# Patient Record
Sex: Male | Born: 1970 | Race: White | Hispanic: No | Marital: Single
Health system: Southern US, Community
[De-identification: ages and names within clinical notes are randomized; demographics above are authoritative.]

## PROBLEM LIST (undated history)

## (undated) DIAGNOSIS — M549 Dorsalgia, unspecified: Secondary | ICD-10-CM

## (undated) DIAGNOSIS — F32A Depression, unspecified: Secondary | ICD-10-CM

## (undated) DIAGNOSIS — K859 Acute pancreatitis without necrosis or infection, unspecified: Secondary | ICD-10-CM

## (undated) DIAGNOSIS — F411 Generalized anxiety disorder: Secondary | ICD-10-CM

## (undated) DIAGNOSIS — M48061 Spinal stenosis, lumbar region without neurogenic claudication: Secondary | ICD-10-CM

## (undated) DIAGNOSIS — F329 Major depressive disorder, single episode, unspecified: Secondary | ICD-10-CM

## (undated) DIAGNOSIS — I10 Essential (primary) hypertension: Secondary | ICD-10-CM

## (undated) DIAGNOSIS — G4700 Insomnia, unspecified: Secondary | ICD-10-CM

## (undated) DIAGNOSIS — F17200 Nicotine dependence, unspecified, uncomplicated: Secondary | ICD-10-CM

## (undated) DIAGNOSIS — M1990 Unspecified osteoarthritis, unspecified site: Secondary | ICD-10-CM

## (undated) HISTORY — DX: Essential (primary) hypertension: I10

## (undated) HISTORY — DX: Major depressive disorder, single episode, unspecified: F32.9

## (undated) HISTORY — DX: Unspecified osteoarthritis, unspecified site: M19.90

## (undated) HISTORY — DX: Nicotine dependence, unspecified, uncomplicated: F17.200

## (undated) HISTORY — DX: Depression, unspecified: F32.A

## (undated) HISTORY — DX: Insomnia, unspecified: G47.00

## (undated) HISTORY — DX: Acute pancreatitis without necrosis or infection, unspecified: K85.90

## (undated) HISTORY — DX: Spinal stenosis, lumbar region without neurogenic claudication: M48.061

## (undated) HISTORY — DX: Dorsalgia, unspecified: M54.9

## (undated) HISTORY — DX: Generalized anxiety disorder: F41.1

---

## 1999-11-26 ENCOUNTER — Ambulatory Visit (HOSPITAL_COMMUNITY): Admission: RE | Admit: 1999-11-26 | Discharge: 1999-11-26 | Payer: Self-pay | Admitting: Family Medicine

## 2004-12-13 ENCOUNTER — Inpatient Hospital Stay (HOSPITAL_COMMUNITY): Admission: AD | Admit: 2004-12-13 | Discharge: 2004-12-16 | Payer: Self-pay | Admitting: Psychiatry

## 2004-12-13 ENCOUNTER — Encounter: Payer: Self-pay | Admitting: *Deleted

## 2004-12-14 ENCOUNTER — Ambulatory Visit: Payer: Self-pay | Admitting: Psychiatry

## 2007-09-23 ENCOUNTER — Encounter: Admission: RE | Admit: 2007-09-23 | Discharge: 2007-09-23 | Payer: Self-pay | Admitting: Gastroenterology

## 2007-10-12 ENCOUNTER — Ambulatory Visit (HOSPITAL_COMMUNITY): Admission: RE | Admit: 2007-10-12 | Discharge: 2007-10-12 | Payer: Self-pay | Admitting: Gastroenterology

## 2007-12-02 ENCOUNTER — Ambulatory Visit (HOSPITAL_COMMUNITY): Admission: RE | Admit: 2007-12-02 | Discharge: 2007-12-02 | Payer: Self-pay | Admitting: General Surgery

## 2007-12-02 ENCOUNTER — Encounter (INDEPENDENT_AMBULATORY_CARE_PROVIDER_SITE_OTHER): Payer: Self-pay | Admitting: General Surgery

## 2008-06-16 HISTORY — PX: APPENDECTOMY: SHX54

## 2008-11-16 HISTORY — PX: CHOLECYSTECTOMY: SHX55

## 2009-07-11 ENCOUNTER — Encounter: Admission: RE | Admit: 2009-07-11 | Discharge: 2009-07-11 | Payer: Self-pay | Admitting: Gastroenterology

## 2009-08-21 ENCOUNTER — Ambulatory Visit (HOSPITAL_COMMUNITY): Admission: RE | Admit: 2009-08-21 | Discharge: 2009-08-21 | Payer: Self-pay | Admitting: Gastroenterology

## 2009-12-11 ENCOUNTER — Emergency Department (HOSPITAL_COMMUNITY): Admission: EM | Admit: 2009-12-11 | Discharge: 2009-12-11 | Payer: Self-pay | Admitting: Emergency Medicine

## 2009-12-15 ENCOUNTER — Emergency Department (HOSPITAL_COMMUNITY): Admission: EM | Admit: 2009-12-15 | Discharge: 2009-12-15 | Payer: Self-pay | Admitting: Emergency Medicine

## 2010-07-01 NOTE — Op Note (Signed)
NAME:  Evan Keller, Evan Keller NO.:  [id number]   MEDICAL RECORD NO.:  [id number]          PATIENT TYPE:  AMB   LOCATION:  DAY                          FACILITY:  [facility]   PHYSICIAN:  Ollen Gross. Vernell Morgans, M.D. DATE OF BIRTH:  Jun 18, 1970   DATE OF PROCEDURE:  12/02/2007  DATE OF DISCHARGE:                               OPERATIVE REPORT   PREOPERATIVE DIAGNOSIS:  Gallstones.   POSTOPERATIVE DIAGNOSIS:  Gallstones.   PROCEDURE:  Laparoscopic cholecystectomy with intraoperative  cholangiogram.   SURGEON:  Ollen Gross. Vernell Morgans, M.D.   ASSISTANT:  Leonie Man, M.D.   ANESTHESIA:  General endotracheal.   PROCEDURE IN DETAIL:  After informed consent was obtained the patient  was brought to the operating room and placed in the supine position on  the operating room table.  After adequate induction of general  anesthesia the patient's abdomen was prepped with Betadine and draped in  the usual sterile manner.  A small incision was made just below the  umbilicus.  The patient had had a previous laparoscopic appendectomy and  his old scar was removed sharply with an 11 blade knife.  The area prior  to making the incision was infiltrated with 0.25% Marcaine.  The  incision was carried down through the subcutaneous tissue bluntly with a  hemostat and [employer] retractors until the midline fascia was  identified.  The midline fascia was incised with a 15 blade knife and  each side was grasped with Kocher clamps and elevated anteriorly.  The  preperitoneal space was probed bluntly with a hemostat until the  peritoneum was opened and access was gained to the abdominal cavity.  A  zero Vicryl pursestring stitch was placed in the fascia surrounding the  opening and Hasson cannula was placed through the opening and anchored  into place with the previously placed Vicryl pursestring stitch.  The  abdomen was then insufflated with carbon dioxide without difficulty.  The patient was placed in  reverse Trendelenburg position and rotated  with the right side up.  A laparoscope was inserted through the Hasson  cannula and the right upper quadrant was inspected.  The dome of the  gallbladder and liver readily identified.  Next the epigastric region  was infiltrated with 0.25% Marcaine and a small incision was made with a  15 blade knife and a 10 mm port was placed bluntly through this incision  into the abdominal cavity under direct vision.  Sites were then chosen  laterally on the right side of the abdomen for replacement of the 5 mm  ports.  Each of these areas was infiltrated with 0.25% Marcaine.  Small  stab incisions were made with a 15 blade knife.  Five mm ports were  placed bluntly through these incisions into the abdominal cavity under  direct vision.  A blunt grasper was placed through the lateral-most 5 mm  port and used to grasp the dome of the gallbladder and elevate it  anteriorly and superiorly.  Another blunt grasper was placed through the  other 5 mm port and used to retract on the body  and neck of the  gallbladder.  There were some omental adhesions to the body of the  gallbladder that were taken down by some blunt dissection with the  dissector and some sharp dissection with the electrocautery.  Once this  was accomplished we were able to identify the gallbladder neck area.  Using the electrocautery the peritoneal reflection at the gallbladder  neck was opened.  Blunt dissection was then carried out in this area  until the gallbladder neck/cystic duct junction was readily identified  and a good window was created.  A single clip was placed on the  gallbladder neck.  A small ductotomy was made just below the clip with  the laparoscopic scissors.  A 14 gauge Angiocath was placed  percutaneously through the anterior abdominal wall under direct vision.  A Reddick cholangiogram catheter was placed through the Angiocath and  flushed.  The Reddick catheter was then  placed within the cystic duct  and anchored in place with a clip.  A cholangiogram was obtained that  showed no filling defects, good emptying in the duodenum and adequate  length on the cystic duct.  The anchoring clip and catheter was then  removed from the patient.  Three clips were placed proximally on the  cystic duct and the duct was divided between the two sets of clips.  Posterior to this the cystic artery was identified and again dissected  bluntly in a circumferential manner until a good window was created.  Two clips were placed proximally and one distally and the artery was  divided between the two.  Next the laparoscopic hook cautery device was  used to separate the gallbladder from the liver bed.  Prior to  completely detaching the gallbladder from the liver bed the liver bed  was inspected and several small bleeding points were coagulated with  electrocautery until the area was completely hemostatic.  The  gallbladder was then detached the rest of the way from the liver bed  without difficulty.  A laparoscopic bag was inserted through the  epigastric port.  The gallbladder was placed in the bag and the bag was  sealed.  The abdomen was then irrigated with copious amounts of saline  until the effluent was clear.  The laparoscope was then removed through  the epigastric port.  The infraumbilical port site was then examined  intra-abdominally with the camera.  There was no evidence of any sort of  hernia or abnormality.  A gallbladder grasper was then placed through  the Hasson cannula and used to grasp and open the bag.  The bag with the  gallbladder was removed through the infraumbilical port without  difficulty.  The fascial defect was closed with the previously placed  Vicryl pursestring stitch as well as with another figure-of-eight zero  Vicryl stitch.  The rest of the ports were removed under direct vision  and were found to be hemostatic.  The gas was allowed to escape.   The  skin incisions were all closed with interrupted 4-0 Monocryl  subcuticular stitches.  Dermabond dressings were applied.  The patient  tolerated the procedure well and at the end of the case all needle,  sponge and instrument counts were correct.  The patient was then  awakened and taken to recovery in a stable condition.      Ollen Gross. Vernell Morgans, M.D.  Electronically Signed     PST/MEDQ  D:  12/02/2007  T:  12/02/2007  Job:  [id number]

## 2010-07-04 NOTE — H&P (Signed)
NAME:  Evan Keller, DONATH NO.:  [id number]   MEDICAL RECORD NO.:  [id number]          PATIENT TYPE:  IPS   LOCATION:  0504                          [facility]  [facility]   PHYSICIAN:  Geoffery Lyons, M.D.      DATE OF BIRTH:  [birth year]   DATE OF ADMISSION:  12/13/2004  DATE OF DISCHARGE:                         PSYCHIATRIC ADMISSION ASSESSMENT   IDENTIFYING INFORMATION:  The patient is a 40 year old single white male who  was involuntarily admitted to the [facility] on December 13, 2004.   HISTORY OF PRESENT ILLNESS:  The patient has been experiencing increasing  signs and symptoms of depression over approximately a 41-month period.  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  He tried to commit suicide by carbon monoxide asphyxiation and  was admitted to [facility] for treatment of the same.  Then he was  committed to the [facility].  He denies any current suicidal  ideations or homicidal ideation.  He denies ever having had any auditory or  visual hallucinations.  Screening for mania was negative on for the most  part except for some impulsive behavior, some difficulty in controlling his  anger in the past.  The patient also has been using Vicodin that has been  prescribed for him for an iliotibial band syndrome problem that he has which  is prescribed for him to take three times a day.  He is taking it, he says,  about 5 times a day, and he is also requesting detox from opiates.   PAST PSYCHIATRIC HISTORY:  This is his first admission to the [facility].  He has had no other inpatient psychiatric or detoxification  admissions and no outpatient following.   SOCIAL HISTORY:  The patient lives alone.  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  He has  never been married.  He has no kids.  He was arrested recently for  prescription fraud, writing a prescription for pain medication.  He reports  that he really has no social support system other than his mother who he  talks to occasionally.   FAMILY HISTORY:  He knows of no mental illness.  He reports that his father  abuses alcohol, and his brother smokes marijuana.   ALCOHOL AND DRUG HISTORY:  He drinks alcohol 4-5 drinks daily and has been  somewhat abusing his pain medication.   PRIMARY CARE Roth Ress:  He has no current primary care Alydia Gosser as his  previous primary care Haik Mahoney, Dr. Ruthine Dose, recently moved away.   MEDICAL PROBLEMS:  He has some hypertension which is not treated with any  medication.  He treats it with diet and exercise.  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   MEDICATIONS:  1.  Vicodin, prescribed three times daily.  2.  Xanax 0.5 mg three times a day as needed, which he takes only      occasionally.  3.  He has been on Zoloft in the past and stopped due to weight gain and a      loss of efficacy.   DRUG ALLERGIES:  He knows of no known drug allergies.   PHYSICAL EXAMINATION:  Performed at [facility].  Here at the [facility] he is a well-nourished, well-developed white male who appears  appropriate for his stated age of 23.  His most recent vital signs showed a  temperature of 98.2, pulse 68, respirations 18, blood pressure 146/88.   DIAGNOSTIC STUDIES:  His hematocrit and hemoglobin are within normal limits.  His blood chemistries reveal a mildly low potassium of 3.4.  His liver  function tests are normal.  His urine drug screen was positive for  benzodiazepines and opiates.  At [facility] on December 12, 2004, his alcohol  level was 126.  He has a TSH pending.   MENTAL STATUS EXAM:  He is alert and oriented x 4 with good eye contact and  a blunted affect.  His appearance was disheveled, and his behavior was calm  and  cooperative.  Speech was clear with even pace and tone.  His mood was  depressed.  His thought process was coherent without current suicidal or  homicidal ideation.  No evidence of psychosis or mania was observed.  Cognitive function, his concentration is normal.  His memory is normal.  His  insight and judgment are poor.   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  AXIS II:  Deferred.  AXIS III:  Iliotibial band syndrome.  Hypertension.  AXIS IV:  Moderate-to-severe for problems with his primary support group,  occupational problems, economic problems, problems related to the legal  system.  AXIS V:  Current global assessment of function 32 with past year of 65.   PLAN:  Admit the patient involuntarily.  He is placed on a clonidine  protocol for opiate withdrawal and Librium protocol for alcohol and benzo  withdrawal.  We will start Cymbalta on him.  We can consider adding a mood  stabilizer.  We will work to stabilize his mood and thought process.  We  will work to increase his coping skills and decrease his stressors.  He will  need referral for counseling and for psychiatric followup upon discharge.  His tentative length of stay is 5-7 days.      Yolande Jolly, PA      Geoffery Lyons, M.D.  Electronically Signed    AHW/MEDQ  D:  12/14/2004  T:  12/14/2004  Job:  [id number]

## 2010-07-04 NOTE — Discharge Summary (Signed)
NAME:  Evan Keller, Evan Keller NO.:  [id number]   MEDICAL RECORD NO.:  [id number]          PATIENT TYPE:  IPS   LOCATION:  0503                          [facility]ILITY[facility]  [facility]   PHYSICIAN:  Geoffery Lyons, M.D.      DATE OF BIRTH:  1970-09-24   DATE OF ADMISSION:  12/13/2004  DATE OF DISCHARGE:  12/16/2004                                 DISCHARGE SUMMARY   CHIEF COMPLAINT AND PRESENT ILLNESS:  This was the first admission to [facility] for this 40 year old single white male who was  involuntarily admitted to the [facility].  Had been  experiencing increasing signs and symptoms of depression over a four-month  period.  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  They need to  apply for bankruptcy, loss of an engagement, lack of friends and increased  alcohol consumption.  He tried to commit suicide by carbon monoxide  asphyxiation.  He was admitted to [facility] for treatment.  He was  committed to [facility].  Upon this evaluation, denied any current  suicidal or homicidal ideation. Denies having had any auditory or visual  hallucinations.  He did admit to come impulsive behavior, some difficulty in  controlling his anger in the past.  Has been using Vicodin, taking it five  times or more a day.   PAST PSYCHIATRIC HISTORY:  First time at [facility].  No other  inpatient or outpatient treatment.   ALCOHOL/DRUG HISTORY:  Drinks 4-5 drinks daily.  Has been abusing his pain  medications.   MEDICAL HISTORY:  Denies any history of any major medical conditions.  Had  some hypertension, not treated.  Has had iliotibial __________ syndrome in  legs bilaterally.   MEDICATIONS:  Vicodin prescribed three times a day, Xanax 0.5 mg three times  a day as needed, Zoloft.   PHYSICAL EXAMINATION:  Performed and failed to show any acute findings.   LABORATORY DATA:  Blood chemistry with glucose 129, potassium 3.4.  Liver  enzymes with  SGOT 27, SGPT 26, TSH 1.018.   MENTAL STATUS EXAM:  Alert, cooperative male with good eye contact.  Blunted  affect.  Disheveled.  He was calm, cooperative.  Speech was clear, even pace  and tone.  Mood was depressed.  Thought processes were coherent, relevant.  No suicidal or homicidal ideation.  No delusions.  Cognition was well-  preserved.   ADMISSION DIAGNOSES:  AXIS I:  Opiate abuse.  Rule out alcohol abuse.  Depressive disorder not otherwise specified.  AXIS II:  No diagnosis.  AXIS III:  Iliotibial __________ syndrome, hypertension by history.  AXIS IV:  Moderate.  AXIS V:  GAF upon admission 32; highest GAF in the last year 65.   HOSPITAL COURSE:  He was admitted.  He was started in individual and group  psychotherapy.  He was detoxified with Librium as well as clonidine.  He was  given some Seroquel.  He was started on Cymbalta 30 mg per day.  That was  discontinued and he was placed on Wellbutrin as well as trazodone for sleep.  He endorsed increased signs and symptoms of depression.  He was abusing the  opiates.  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  He is close to declaring bankruptcy.  Broke up an engagement.  He was  dealing with anxiety.  Used to run a lot as a way to deal with the stress.  He was having pain.  Used the opiates in the beginning to deal with the  pain, then they made him feel better.  Admits to using alcohol.  Endorsed  being overwhelmed with all the things going on with him.  On evaluation, he  was anxious, he was tearful, dealing with a sense of loss.  He has been on  Zoloft and Celexa with side effects.  We started working to stabilize a  relapse prevention plan.  We started Wellbutrin XL 150 mg per day.  His goal  was to get himself to a point where he would not use any alcohol or drugs.  He started working on self and coping skills and stress management.  There  was a family session with his family that went  pretty well.  On December 16, 2004, he endorsed he was doing much better.  Endorsed no suicidal ideation,  no homicidal ideation, no hallucinations, no delusions, no cravings.  Endorsed he was committed to not do opiates.  Feeling he was committed to  abstinence.  As he was in full contact with reality, no suicidal or  homicidal ideation, we went ahead and discharged to outpatient follow-up.   DISCHARGE DIAGNOSES:  AXIS I:  Depressive disorder not otherwise specified.  Opiate and alcohol abuse.  AXIS II:  No diagnosis.  AXIS III:  Iliotibial syndrome, hypertension.  AXIS IV:  Moderate.  AXIS V:  GAF upon discharge 55-60.   DISCHARGE MEDICATIONS:  1.  Wellbutrin XL 150 mg per day.  2.  Trazodone 150 mg at night.  3.  Seroquel 25 mg, 1-2 at night.  4.  Robaxin 750 mg four times a day as needed.  5.  Catapres 0.1 mg twice a day for two days, then 0.1 mg, 1 daily for two      days, then discontinue.  6.  Librium 25 mg, 1 twice a day for a day, then Librium 25 mg, 1 daily for      a day, then discontinue.   FOLLOW UP:  Scheduled by patient as he wanted to make his own appointments.      Geoffery Lyons, M.D.  Electronically Signed     IL/MEDQ  D:  01/12/2005  T:  01/12/2005  Job:  [id number]

## 2010-07-25 ENCOUNTER — Ambulatory Visit (INDEPENDENT_AMBULATORY_CARE_PROVIDER_SITE_OTHER): Payer: Managed Care, Other (non HMO) | Admitting: Family Medicine

## 2010-07-25 ENCOUNTER — Encounter: Payer: Self-pay | Admitting: Family Medicine

## 2010-07-25 DIAGNOSIS — G8929 Other chronic pain: Secondary | ICD-10-CM

## 2010-07-25 DIAGNOSIS — F329 Major depressive disorder, single episode, unspecified: Secondary | ICD-10-CM

## 2010-07-25 DIAGNOSIS — M549 Dorsalgia, unspecified: Secondary | ICD-10-CM

## 2010-07-25 DIAGNOSIS — F32A Depression, unspecified: Secondary | ICD-10-CM | POA: Insufficient documentation

## 2010-07-25 MED ORDER — OXYCODONE HCL 30 MG PO TABS: 30.0000 mg | ORAL_TABLET | Freq: Four times a day (QID) | ORAL | Status: DC | PRN

## 2010-07-25 MED ORDER — BUPROPION HCL ER (XL) 150 MG PO TB24: 150.0000 mg | ORAL_TABLET | ORAL | Status: DC

## 2010-07-25 NOTE — Progress Notes (Signed)
New to est care but prev well known to me. Back pain.  Longstanding.  Had been on medical mgmt with oxycodone.  No abuse, misuse, diversion.  No GI ADE.  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  No new sx.  Continue to have have frequent, daily pain w/o radiation into the legs or weakness distally.  No bowel, bladder sx.  Does better with stretching, exercise but this has fallen off due to the below.   H/o MDD.  Job stress.  Depressed mood.  Prev on wellbutrin and in retrospect this had helped some.  No SI/HI.  Intolerant of SSRI.  Asking about starting back on wellbutrin.  He agrees to the plan below.  PMH and SH reviewed  ROS: See HPI, otherwise noncontributory.  Meds, vitals, and allergies reviewed.   See scanned records.  nad but uncomfortable Speech fluent, judgement intact ncat Mmm rrr ctab abd soft Back w/o midline pain but diffuse paraspinal muscle tenderness Limp due pain noted

## 2010-07-25 NOTE — Patient Instructions (Signed)
Start on the wellbutrin 1 a day for 1 week and then increase to 2 tabs a day.  Call me with an update in 2 weeks, sooner if needed. I want to see you back in about 3-4 weeks.  visit.  Take care.

## 2010-07-27 ENCOUNTER — Encounter: Payer: Self-pay | Admitting: Family Medicine

## 2010-07-27 NOTE — Assessment & Plan Note (Signed)
Longstanding.  No change in meds. Continue as is with meds and inc exercise, stretching. No ADE from the meds, no sedation. Okay for outpatient fu.  Old records reviewed. >45 min spent with face to face with patient >50% counseling.

## 2010-07-27 NOTE — Assessment & Plan Note (Signed)
Start back on meds, no SI/HI.  Okay for outpatient fu and will call back with update.  Plan for fu in 1 month, sooner prn. He agrees.  D/w pt about staying off substance, inc in exercise, etc.

## 2010-07-29 ENCOUNTER — Telehealth: Payer: Self-pay | Admitting: *Deleted

## 2010-07-29 NOTE — Telephone Encounter (Signed)
Pt's records from [place] copied, sent for scanning and originals mailed back to patient at his home address.

## 2010-08-01 ENCOUNTER — Telehealth: Payer: Self-pay | Admitting: *Deleted

## 2010-08-01 MED ORDER — SERTRALINE HCL 50 MG PO TABS: ORAL_TABLET | ORAL | Status: DC

## 2010-08-01 NOTE — Telephone Encounter (Signed)
Vomiting started after taking wellbutrin, resolved after stopping.  We talked about the zoloft, prev worked but weight gain was an issue.  He did have effect with zoloft before and wanted to start back.  We talked about options and this seems reasonable.    Plan: zoloft 25mg  for 1 week and then 50mg  a day.  [facility] on [address].    He was asking about work.  Depressed mood.  That has affected his sleep and concentration/focus.  Both could have affected his job performance.  It is reasonable to pursue temporary medical leave for treatment of depression.  He'll send me the papers.   F/u appointment 08/14/10.  He'll notify me of other problems in the meantime.

## 2010-08-01 NOTE — Telephone Encounter (Signed)
Patient is asking if you could call him at your convenience. He says that the wellbutrin made him deathly ill with throwing up day and night. He also wants to take a leave of absence from work and wants to talk with you about that. He says that he really needs to talk with you instead of the message being relayed back to him. He can be reached at [phone], but will not be available between 12:45 and 1:45 today, but will be available to answer anytime after that.

## 2010-08-07 ENCOUNTER — Ambulatory Visit: Payer: Managed Care, Other (non HMO) | Admitting: Family Medicine

## 2010-08-08 ENCOUNTER — Ambulatory Visit: Payer: Self-pay | Admitting: Family Medicine

## 2010-08-14 ENCOUNTER — Encounter: Payer: Self-pay | Admitting: Family Medicine

## 2010-08-14 ENCOUNTER — Ambulatory Visit (INDEPENDENT_AMBULATORY_CARE_PROVIDER_SITE_OTHER): Payer: Managed Care, Other (non HMO) | Admitting: Family Medicine

## 2010-08-14 DIAGNOSIS — M549 Dorsalgia, unspecified: Secondary | ICD-10-CM

## 2010-08-14 DIAGNOSIS — T63461A Toxic effect of venom of wasps, accidental (unintentional), initial encounter: Secondary | ICD-10-CM

## 2010-08-14 DIAGNOSIS — F329 Major depressive disorder, single episode, unspecified: Secondary | ICD-10-CM

## 2010-08-14 DIAGNOSIS — G8929 Other chronic pain: Secondary | ICD-10-CM

## 2010-08-14 DIAGNOSIS — T6391XA Toxic effect of contact with unspecified venomous animal, accidental (unintentional), initial encounter: Secondary | ICD-10-CM

## 2010-08-14 DIAGNOSIS — Z91030 Bee allergy status: Secondary | ICD-10-CM | POA: Insufficient documentation

## 2010-08-14 MED ORDER — EPINEPHRINE 0.3 MG/0.3ML IJ DEVI: 0.3000 mg | Freq: Once | INTRAMUSCULAR | Status: DC

## 2010-08-14 MED ORDER — OXYCODONE HCL 30 MG PO TABS: 30.0000 mg | ORAL_TABLET | Freq: Four times a day (QID) | ORAL | Status: DC | PRN

## 2010-08-14 NOTE — Assessment & Plan Note (Signed)
rx written today and pharmacy called about early refill since he'll be out of town during next fill date.

## 2010-08-14 NOTE — Patient Instructions (Addendum)
Call me on 08/25/10 or 08/26/10 with an update, sooner if needed.  We'll need to make plans about your potential date for return to work.  Continue the zoloft in the meantime.   If you have a bee sting, take 10mg  of claritin.  If you get short of breath or have lip/tongue swelling, then use the epipen and dial 911.  Don't leave the pen in a hot car.

## 2010-08-14 NOTE — Progress Notes (Signed)
Follow up for depression:  Depressed Mood: yes Sleep decreased/ Insomnia/Early awakening: yes- insomnia Interest decreased in activities: yes, less exercise Guilt or worthlessness: no Energy decreased:yes Concentration difficulties:yes Appetite disturbance or weight loss: lost 3 lbs recently w/o effort, dec in appetite Psychomotor retardation/agitation:no Suicidal thoughts: no  Just started on zoloft, at the lower dose.  He was up all night with a sick dog and he got panicked before his BP was checked.  Recheck BP was lower.  He's going to [place] with a friend for a few weeks.  He's off work now and trying to looking forward to the time away.    H/o bee sting on R arm with sig local edema but no airway sx.  Asking about options.   Meds, vitals, and allergies reviewed.   ROS: See HPI.  Otherwise, noncontributory.  nad but tired appearing r elbow with resolving epithelial defect but no fluctuance. Speech wnl Initially anxious appearing but then calms down

## 2010-08-14 NOTE — Assessment & Plan Note (Signed)
Epipen given along with routine instructions.

## 2010-08-14 NOTE — Assessment & Plan Note (Addendum)
Continue current meds with plan to inc to 50mg  of SSRI and then call back with update.  No SI/HI. Contracts for safety.  Will determine return to work at as symptoms improve.  Not yet ready to go to work.  >25 min spent with face to face with patient, >50% counseling.

## 2010-08-15 ENCOUNTER — Other Ambulatory Visit: Payer: Self-pay | Admitting: *Deleted

## 2010-08-15 MED ORDER — PANTOPRAZOLE SODIUM 40 MG PO TBEC: 40.0000 mg | DELAYED_RELEASE_TABLET | Freq: Every day | ORAL | Status: DC

## 2010-08-18 ENCOUNTER — Emergency Department (HOSPITAL_COMMUNITY): Payer: Managed Care, Other (non HMO)

## 2010-08-18 ENCOUNTER — Emergency Department (HOSPITAL_COMMUNITY)
Admission: EM | Admit: 2010-08-18 | Discharge: 2010-08-18 | Disposition: A | Discharge status: Home / Self Care | Payer: Managed Care, Other (non HMO) | Attending: Emergency Medicine | Admitting: Emergency Medicine

## 2010-08-18 DIAGNOSIS — I491 Atrial premature depolarization: Secondary | ICD-10-CM | POA: Insufficient documentation

## 2010-08-18 DIAGNOSIS — K859 Acute pancreatitis without necrosis or infection, unspecified: Secondary | ICD-10-CM | POA: Insufficient documentation

## 2010-08-18 DIAGNOSIS — R1011 Right upper quadrant pain: Secondary | ICD-10-CM | POA: Insufficient documentation

## 2010-08-18 DIAGNOSIS — K219 Gastro-esophageal reflux disease without esophagitis: Secondary | ICD-10-CM | POA: Insufficient documentation

## 2010-08-18 DIAGNOSIS — F1010 Alcohol abuse, uncomplicated: Secondary | ICD-10-CM | POA: Insufficient documentation

## 2010-08-18 DIAGNOSIS — F10229 Alcohol dependence with intoxication, unspecified: Secondary | ICD-10-CM | POA: Insufficient documentation

## 2010-08-18 DIAGNOSIS — I10 Essential (primary) hypertension: Secondary | ICD-10-CM | POA: Insufficient documentation

## 2010-08-18 DIAGNOSIS — R002 Palpitations: Secondary | ICD-10-CM | POA: Insufficient documentation

## 2010-08-18 DIAGNOSIS — R112 Nausea with vomiting, unspecified: Secondary | ICD-10-CM | POA: Insufficient documentation

## 2010-08-18 DIAGNOSIS — K7010 Alcoholic hepatitis without ascites: Secondary | ICD-10-CM | POA: Insufficient documentation

## 2010-08-18 LAB — ETHANOL: Alcohol, Ethyl (B): 292 mg/dL — ABNORMAL HIGH (ref 0–11)

## 2010-08-18 LAB — CBC
HCT: 46.3 % (ref 39.0–52.0)
MCHC: 36.9 g/dL — ABNORMAL HIGH (ref 30.0–36.0)
MCV: 102.4 fL — ABNORMAL HIGH (ref 78.0–100.0)
RBC: 4.52 MIL/uL (ref 4.22–5.81)
WBC: 4 10*3/uL (ref 4.0–10.5)

## 2010-08-18 LAB — COMPREHENSIVE METABOLIC PANEL
ALT: 140 U/L — ABNORMAL HIGH (ref 0–53)
AST: 234 U/L — ABNORMAL HIGH (ref 0–37)
Alkaline Phosphatase: 94 U/L (ref 39–117)
BUN: 11 mg/dL (ref 6–23)
CO2: 31 mEq/L (ref 19–32)
Sodium: 131 mEq/L — ABNORMAL LOW (ref 135–145)
Total Bilirubin: 0.6 mg/dL (ref 0.3–1.2)

## 2010-08-18 LAB — URINE MICROSCOPIC-ADD ON

## 2010-08-18 LAB — DIFFERENTIAL
Basophils Relative: 1 % (ref 0–1)
Eosinophils Absolute: 0 10*3/uL (ref 0.0–0.7)
Eosinophils Relative: 1 % (ref 0–5)
Lymphs Abs: 1.2 10*3/uL (ref 0.7–4.0)
Monocytes Relative: 9 % (ref 3–12)
Neutrophils Relative %: 61 % (ref 43–77)

## 2010-08-18 LAB — URINALYSIS, ROUTINE W REFLEX MICROSCOPIC
Bilirubin Urine: NEGATIVE
Glucose, UA: NEGATIVE mg/dL
Hgb urine dipstick: NEGATIVE
pH: 7.5 (ref 5.0–8.0)

## 2010-08-20 ENCOUNTER — Telehealth: Payer: Self-pay | Admitting: Family Medicine

## 2010-08-20 NOTE — Telephone Encounter (Signed)
Call pt and get him scheduled for 30 min OV within the next few days.  Thanks.

## 2010-08-21 NOTE — Telephone Encounter (Signed)
Patient notified as instructed by telephone. Pt said he was unable to go out of town as planned due to issues with vomiting and pt was going to call today to ask Dr Para March to do a GI referral. Pt said before he makes another appt with Dr Para March he would like to talk with him first. Pt can be reached at [phone]. Thank you.

## 2010-08-21 NOTE — Telephone Encounter (Signed)
Left v/m at all contact # for pt to return call.

## 2010-08-21 NOTE — Telephone Encounter (Signed)
I called pt and reiterated the need for the f/u appointment.  He understood. I'll talk to him about his pain meds and the etoh intake/mood issues at the OV.

## 2010-08-22 ENCOUNTER — Ambulatory Visit (INDEPENDENT_AMBULATORY_CARE_PROVIDER_SITE_OTHER): Payer: Managed Care, Other (non HMO) | Admitting: Family Medicine

## 2010-08-22 ENCOUNTER — Encounter: Payer: Self-pay | Admitting: Family Medicine

## 2010-08-22 ENCOUNTER — Telehealth: Payer: Self-pay | Admitting: *Deleted

## 2010-08-22 VITALS — BP 170/110 | HR 92 | Temp 98.4°F | Wt 220.8 lb

## 2010-08-22 DIAGNOSIS — M549 Dorsalgia, unspecified: Secondary | ICD-10-CM

## 2010-08-22 DIAGNOSIS — K859 Acute pancreatitis without necrosis or infection, unspecified: Secondary | ICD-10-CM

## 2010-08-22 DIAGNOSIS — G8929 Other chronic pain: Secondary | ICD-10-CM

## 2010-08-22 DIAGNOSIS — F1010 Alcohol abuse, uncomplicated: Secondary | ICD-10-CM | POA: Insufficient documentation

## 2010-08-22 LAB — COMPREHENSIVE METABOLIC PANEL
AST: 167 U/L — ABNORMAL HIGH (ref 0–37)
Albumin: 4.6 g/dL (ref 3.5–5.2)
CO2: 34 mEq/L — ABNORMAL HIGH (ref 19–32)
Calcium: 9.2 mg/dL (ref 8.4–10.5)
Creatinine, Ser: 0.8 mg/dL (ref 0.4–1.5)
GFR: 114.97 mL/min (ref >60.00)
Glucose, Bld: 106 mg/dL — ABNORMAL HIGH (ref 70–99)
Total Bilirubin: 0.7 mg/dL (ref 0.3–1.2)
Total Protein: 7.6 g/dL (ref 6.0–8.3)

## 2010-08-22 LAB — LIPASE: Lipase: 66 U/L — ABNORMAL HIGH (ref 11.0–59.0)

## 2010-08-22 MED ORDER — SERTRALINE HCL 50 MG PO TABS: ORAL_TABLET | ORAL | Status: DC

## 2010-08-22 NOTE — Telephone Encounter (Signed)
Pt states that the STD paperwork has been sent out-unsure if fax or via mail to you today. Pt is inquiring about the date that needs to be put on paperwork as well. Pt is sending a letter with some note questions that he forgot to ask while in office earlier. Pt reiterates that his STD will run out on 08/31/2010 without completion of his paperwork.

## 2010-08-22 NOTE — Patient Instructions (Addendum)
Call about getting a psychiatry appointment. Look into AA or other alcohol treatment. Increase the zoloft to 50mg  a day. See Aram Beecham about your referral before your leave today. Call early next week with an update on your progress.   Call me when you get to the last week of your pain meds.   You can get your results through our phone system.  Follow the instructions on the blue card.
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
## 2010-08-23 NOTE — Telephone Encounter (Signed)
I will address the papers as they come in.

## 2010-08-24 ENCOUNTER — Encounter: Payer: Self-pay | Admitting: Family Medicine

## 2010-08-24 NOTE — Assessment & Plan Note (Addendum)
>  45 min spent with face to face with patient, >50% counseling and/or coordinating care.  He is responsible for his behavior.  Drinking with his other conditions is a high risk problem that needs treatment.  It is likely related to the depression and he is to self refer for psych eval/etoh treatment.  He is able to do this and agrees.  He'll call back early next week with update.  I want him to have eval by pain clinic (if possible) to see what can be done to get him on a lower dose of opiates.  If he is noncompliant in the future, I'll have no choice but to discharge him from the practice.    He understood all of this.

## 2010-08-24 NOTE — Assessment & Plan Note (Signed)
Benign abd exam.  See notes on labs about his LFT elevation.  No jaundice and he has advanced his diet.

## 2010-08-26 ENCOUNTER — Telehealth: Payer: Self-pay | Admitting: Family Medicine
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
## 2010-08-27 ENCOUNTER — Telehealth: Payer: Self-pay | Admitting: Family Medicine

## 2010-08-27 ENCOUNTER — Encounter: Payer: Self-pay | Admitting: *Deleted

## 2010-08-27 ENCOUNTER — Telehealth: Payer: Self-pay | Admitting: *Deleted

## 2010-08-27 ENCOUNTER — Encounter: Payer: Self-pay | Admitting: Family Medicine

## 2010-08-27 NOTE — Telephone Encounter (Signed)
The [employer] form was done.  Please forward this to them also as that should provide the needed details.

## 2010-08-27 NOTE — Telephone Encounter (Signed)
I faxed everything yesterday that was required. I actually faxed it to [facility] attn at [phone] which was the # and contact provided on the original form that was sent to you. I have already sent it to be scanned, because I never got a fax form back stating that it didn't go through.

## 2010-08-27 NOTE — Telephone Encounter (Signed)
Did you want me to send them a copy of the telephone note stating that you had extended the leave? It does also state that he has been discharged. Is that something they need to know?

## 2010-08-27 NOTE — Telephone Encounter (Signed)
To whom it may concern,   I have recommended that Evan Keller have his short term disability extended to 09/12/10 to allow for effect of treatment.  It may need to be extended beyond that point.    Crawford Givens, MD

## 2010-08-27 NOTE — Telephone Encounter (Signed)
Please talk to me about this.  Thanks.  

## 2010-08-27 NOTE — Telephone Encounter (Signed)
Patient says that he just received a call from HR and they are needing a note stating that you have extended his medical leave. He is asking if this can be faxed to his job attention [employer]. Fax number is [phone].

## 2010-08-28 ENCOUNTER — Telehealth: Payer: Self-pay | Admitting: Family Medicine

## 2010-08-28 NOTE — Telephone Encounter (Signed)
Dismissal letter sent out by certified mail. Will update chart upon receiving delivery verification.

## 2010-08-28 NOTE — Telephone Encounter (Signed)
Note printed and faxed as directed.

## 2010-09-01 ENCOUNTER — Other Ambulatory Visit: Payer: Self-pay | Admitting: *Deleted

## 2010-09-01 NOTE — Telephone Encounter (Signed)
Please give to pt when printed.

## 2010-09-01 NOTE — Telephone Encounter (Signed)
Patient is asking for a 30 day supply of all of his medications until he is able to find a new provider. He also says that for the oxycodone his insurance will only pay for 210 mg so his pharmacy told him to request that you write it for 210 mg and also 30 mg so that he can take them together. He said that both scripts will need to state that he will be taking them together so that it will not bet questioned. Please call when scripts are ready.

## 2010-09-02 ENCOUNTER — Telehealth: Payer: Self-pay | Admitting: Family Medicine

## 2010-09-02 MED ORDER — OXYCODONE HCL 30 MG PO TABS: 30.0000 mg | ORAL_TABLET | Freq: Four times a day (QID) | ORAL | Status: DC | PRN

## 2010-09-02 MED ORDER — AMLODIPINE BESYLATE 5 MG PO TABS: 5.0000 mg | ORAL_TABLET | Freq: Every day | ORAL | Status: DC

## 2010-09-02 MED ORDER — SERTRALINE HCL 50 MG PO TABS: ORAL_TABLET | ORAL | Status: DC

## 2010-09-02 MED ORDER — OXYCODONE HCL 30 MG PO TABS: ORAL_TABLET | ORAL | Status: DC

## 2010-09-02 MED ORDER — PANTOPRAZOLE SODIUM 40 MG PO TBEC: 40.0000 mg | DELAYED_RELEASE_TABLET | Freq: Every day | ORAL | Status: DC

## 2010-09-02 MED ORDER — ZOLPIDEM TARTRATE 10 MG PO TABS: 10.0000 mg | ORAL_TABLET | Freq: Every evening | ORAL | Status: DC | PRN

## 2010-09-02 MED ORDER — CARISOPRODOL 350 MG PO TABS: 350.0000 mg | ORAL_TABLET | Freq: Four times a day (QID) | ORAL | Status: DC | PRN

## 2010-09-02 MED ORDER — EPINEPHRINE 0.3 MG/0.3ML IJ DEVI: 0.3000 mg | Freq: Once | INTRAMUSCULAR | Status: DC

## 2010-09-02 MED ORDER — ALPRAZOLAM 1 MG PO TABS: 1.0000 mg | ORAL_TABLET | Freq: Every day | ORAL | Status: DC | PRN

## 2010-09-02 NOTE — Telephone Encounter (Signed)
[facility] faxed over the appt infor for Evan Keller. He has an appt with Dr Nilsa Nutting on 09/10/2010 at 1:30pm, patient is aware of this appt. Just wanted you to know.

## 2010-09-02 NOTE — Telephone Encounter (Signed)
Noted  

## 2010-09-02 NOTE — Telephone Encounter (Signed)
Rx place up front for pick up. Patient notified.

## 2010-09-09 ENCOUNTER — Encounter: Payer: Self-pay | Admitting: Family Medicine

## 2010-09-09 ENCOUNTER — Telehealth: Payer: Self-pay | Admitting: Family Medicine

## 2010-09-09 NOTE — Telephone Encounter (Signed)
Based on communication from pt this AM (given to me by P Sockwell), I had recommended ER eval for abd pain (Sockwell to notify pt).  I am not comfortable extending a librium taper for him.  Given that he is still having symptoms that would need eval at ER, I will agree with extending his short term disability to 09/25/10.  Beyond that, he'll need to see eval by his new PMD.  Please fax a note to his short term disability to extend current period to 09/25/10.

## 2010-09-09 NOTE — Telephone Encounter (Signed)
Letter faxed as instructed per Dr. Para March.

## 2010-09-11 ENCOUNTER — Telehealth: Payer: Self-pay | Admitting: *Deleted

## 2010-09-11 NOTE — Telephone Encounter (Signed)
Pt states he needs note faxed to his insurance company to have his short term disability extended.  He says it is currently only good through tomorrow.  We will need to fax a note to Waymon Budge at [facility] dept at [employer], pt's employer, letting them know that it has been extended.  Fax number is [phone].  Also, [employer] was to have faxed a form that needs to be signed and faxed back to them.  Have you seen this form?

## 2010-09-11 NOTE — Telephone Encounter (Signed)
Letter from 09/09/10 faxed

## 2010-09-11 NOTE — Telephone Encounter (Signed)
I don't have a new form.  The prev note should cover the extension.

## 2010-09-11 NOTE — Telephone Encounter (Signed)
See letter from 09/09/10.

## 2010-09-15 ENCOUNTER — Telehealth: Payer: Self-pay | Admitting: Family Medicine

## 2010-09-15 NOTE — Telephone Encounter (Signed)
Received signed [employer] verifying delivery of dismissal letter.

## 2010-09-22 ENCOUNTER — Telehealth: Payer: Self-pay | Admitting: *Deleted

## 2010-09-22 NOTE — Telephone Encounter (Signed)
I cannot extend his dates.  One of the reasons to give a month notice is to allow for him to establish care with another MD.  Please notify pt.

## 2010-09-22 NOTE — Telephone Encounter (Signed)
Mother is asking, on behalf of Evan Keller, that his work note date be changed to 10/14/2010 from 09/25/2010.  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  His disability date is good until 10/14/2010.

## 2010-09-24 NOTE — Telephone Encounter (Signed)
Forwarded message to [place]. I did called the pt to inform him that the manager will be calling him on tomorrow to discuss..Evan Keller

## 2010-09-24 NOTE — Telephone Encounter (Signed)
Patient called back regarding the status of this message.  I spoke with Dr. Para March to see if it was ok for me to speak with this patient and advise him as instructed.  Dr. Para March advised me not to speak with patient and forward message to [facility] since she was aware of the situation.

## 2010-10-03 NOTE — Telephone Encounter (Signed)
See documentation/information mailed to pt

## 2010-10-08 ENCOUNTER — Telehealth: Payer: Self-pay | Admitting: Family Medicine

## 2010-10-13 ENCOUNTER — Other Ambulatory Visit: Payer: Self-pay | Admitting: Family Medicine

## 2010-10-13 ENCOUNTER — Ambulatory Visit (INDEPENDENT_AMBULATORY_CARE_PROVIDER_SITE_OTHER): Payer: Managed Care, Other (non HMO) | Admitting: Family Medicine

## 2010-10-13 ENCOUNTER — Encounter: Payer: Self-pay | Admitting: Family Medicine

## 2010-10-13 DIAGNOSIS — M549 Dorsalgia, unspecified: Secondary | ICD-10-CM

## 2010-10-13 DIAGNOSIS — E7800 Pure hypercholesterolemia, unspecified: Secondary | ICD-10-CM

## 2010-10-13 DIAGNOSIS — Z79899 Other long term (current) drug therapy: Secondary | ICD-10-CM

## 2010-10-13 DIAGNOSIS — G8929 Other chronic pain: Secondary | ICD-10-CM

## 2010-10-13 DIAGNOSIS — I10 Essential (primary) hypertension: Secondary | ICD-10-CM | POA: Insufficient documentation

## 2010-10-13 DIAGNOSIS — Z23 Encounter for immunization: Secondary | ICD-10-CM

## 2010-10-13 DIAGNOSIS — F329 Major depressive disorder, single episode, unspecified: Secondary | ICD-10-CM

## 2010-10-13 MED ORDER — OXYCODONE HCL 30 MG PO TABS: 30.0000 mg | ORAL_TABLET | Freq: Four times a day (QID) | ORAL | Status: DC | PRN

## 2010-10-13 MED ORDER — SERTRALINE HCL 50 MG PO TABS: ORAL_TABLET | ORAL | Status: DC

## 2010-10-13 MED ORDER — AMLODIPINE BESYLATE 5 MG PO TABS: 5.0000 mg | ORAL_TABLET | Freq: Every day | ORAL | Status: DC

## 2010-10-13 NOTE — Patient Instructions (Addendum)
Please continue to check blood pressures at home--if persistently >140/90, please return for adjustment of your medications.  If you find that your depression is worsening, please follow up sooner than the 6 months that we recommended today, for adjustment of your medications.  Please work on quitting smoking!!
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
## 2010-10-14 ENCOUNTER — Emergency Department (HOSPITAL_COMMUNITY)
Admission: EM | Admit: 2010-10-14 | Discharge: 2010-10-14 | Disposition: A | Discharge status: Home / Self Care | Payer: Managed Care, Other (non HMO) | Attending: Emergency Medicine | Admitting: Emergency Medicine

## 2010-10-14 ENCOUNTER — Emergency Department (HOSPITAL_COMMUNITY): Payer: Managed Care, Other (non HMO)

## 2010-10-14 DIAGNOSIS — F341 Dysthymic disorder: Secondary | ICD-10-CM | POA: Insufficient documentation

## 2010-10-14 DIAGNOSIS — M542 Cervicalgia: Secondary | ICD-10-CM | POA: Insufficient documentation

## 2010-10-14 DIAGNOSIS — M545 Low back pain, unspecified: Secondary | ICD-10-CM | POA: Insufficient documentation

## 2010-10-14 DIAGNOSIS — K219 Gastro-esophageal reflux disease without esophagitis: Secondary | ICD-10-CM | POA: Insufficient documentation

## 2010-10-14 DIAGNOSIS — S139XXA Sprain of joints and ligaments of unspecified parts of neck, initial encounter: Secondary | ICD-10-CM | POA: Insufficient documentation

## 2010-10-14 DIAGNOSIS — S335XXA Sprain of ligaments of lumbar spine, initial encounter: Secondary | ICD-10-CM | POA: Insufficient documentation

## 2010-10-14 DIAGNOSIS — I10 Essential (primary) hypertension: Secondary | ICD-10-CM | POA: Insufficient documentation

## 2010-10-14 DIAGNOSIS — Z79899 Other long term (current) drug therapy: Secondary | ICD-10-CM | POA: Insufficient documentation

## 2010-10-14 LAB — CBC WITH DIFFERENTIAL/PLATELET
Basophils Absolute: 0.1 10*3/uL (ref 0.0–0.1)
Basophils Relative: 1 % (ref 0–1)
Eosinophils Relative: 2 % (ref 0–5)
HCT: 33 % — ABNORMAL LOW (ref 39.0–52.0)
Lymphocytes Relative: 30 % (ref 12–46)
MCHC: 34.5 g/dL (ref 30.0–36.0)
MCV: 106.8 fL — ABNORMAL HIGH (ref 78.0–100.0)
Monocytes Absolute: 0.5 10*3/uL (ref 0.1–1.0)
RDW: 16.8 % — ABNORMAL HIGH (ref 11.5–15.5)

## 2010-10-14 LAB — COMPREHENSIVE METABOLIC PANEL
Albumin: 4.4 g/dL (ref 3.5–5.2)
BUN: 3 mg/dL — ABNORMAL LOW (ref 6–23)
Calcium: 9.6 mg/dL (ref 8.4–10.5)
Chloride: 100 mEq/L (ref 96–112)
Glucose, Bld: 83 mg/dL (ref 70–99)
Potassium: 4.2 mEq/L (ref 3.5–5.3)

## 2010-10-14 LAB — VITAMIN B12: Vitamin B-12: 507 pg/mL (ref 211–911)

## 2010-10-14 LAB — LIPID PANEL
Cholesterol: 242 mg/dL — ABNORMAL HIGH (ref 0–200)
HDL: 40 mg/dL (ref >39)
Triglycerides: 345 mg/dL — ABNORMAL HIGH (ref <150)

## 2010-10-15 ENCOUNTER — Telehealth: Payer: Self-pay | Admitting: Family Medicine

## 2010-10-15 NOTE — Telephone Encounter (Signed)
PT CALLED AND STATED THAT WHEN HE WAS IN HE WAS TO BE FASTING AND TOLD YOU HE WAS BUT HE FORGOT THAT HE HAD DRANK A GLASS OF SWEET TEA. HE IS NOW AFRAID LABS WILL BE MESSED UP.  ALSO STATES THAT HE WAS REAR ENDING LEAVING OUR OFFICE AND WAS SENT TO ER.  IT HAS JUST MADE HIS NECK ISSUE WORSE. PT STATES HE JUST WANTED YOU TO KNOW.

## 2010-10-15 NOTE — Telephone Encounter (Signed)
Advise pt that the sweet tea may contribute to why TG were elevated (although his glu was normal).  Ensure completely fasting when it is repeated.  I was aware of the ER visit.  Advise him that I don't recommend he take the Percocet that was rx'd, along with his oxycodone. He shouldn't be on two types of short-acting narcotics. I'm happy to refer to PT if needed for ongoing neck pain related to this, just let us know.  Also, I would like for you to remind him to seriously consider getting a second opinion regarding back surgery--I recommend either [facility] or [facility] (ie. Dr. Marikay Alar).  Next time he requests refill of oxycodone, we will need to have him sign a medication contract (I didn't have him sign one when he was here this week) when he picks it up.

## 2010-10-16 ENCOUNTER — Other Ambulatory Visit: Payer: Self-pay | Admitting: *Deleted

## 2010-10-16 ENCOUNTER — Telehealth: Payer: Self-pay | Admitting: Family Medicine

## 2010-10-16 DIAGNOSIS — D649 Anemia, unspecified: Secondary | ICD-10-CM

## 2010-10-16 DIAGNOSIS — E782 Mixed hyperlipidemia: Secondary | ICD-10-CM

## 2010-10-16 DIAGNOSIS — R7401 Elevation of levels of liver transaminase levels: Secondary | ICD-10-CM

## 2010-10-16 NOTE — Telephone Encounter (Signed)
Please have pain contract ready and up front for him to sign (for #210/month).  We will need to know which pharmacy he uses--he can fill that in when he arrives.  I have filled form out with his med info.

## 2010-10-22 NOTE — Telephone Encounter (Signed)
PAIN CONTRACT DONE BY VERONICA & IS UP FRONT-LM

## 2010-11-03 ENCOUNTER — Other Ambulatory Visit: Payer: Self-pay | Admitting: Orthopedic Surgery

## 2010-11-03 ENCOUNTER — Ambulatory Visit
Admission: RE | Admit: 2010-11-03 | Discharge: 2010-11-03 | Disposition: A | Discharge status: Home / Self Care | Payer: Managed Care, Other (non HMO) | Source: Ambulatory Visit | Attending: Orthopedic Surgery | Admitting: Orthopedic Surgery

## 2010-11-03 DIAGNOSIS — IMO0002 Reserved for concepts with insufficient information to code with codable children: Secondary | ICD-10-CM

## 2010-11-06 ENCOUNTER — Telehealth: Payer: Self-pay | Admitting: *Deleted

## 2010-11-06 NOTE — Telephone Encounter (Signed)
Patient called stating that he will be coming in tomorrow for labs @ 1:30pm and he will sign pain management contract. He would also like his rx for oxycodone 30mg  #210 1-2 q6hrs prn ready as well. Thanks.

## 2010-11-06 NOTE — Telephone Encounter (Signed)
Patient called and stated that he spoke with you on Monday in reference to a prior auth that needed completed to [facility] for his #210 of oxycodone, he was calling to see if it was approved. Please call pt @ [phone], he stated that he will be in and out of meeting all day so if you get his voicemail to just leave him a message. Thanks.

## 2010-11-07 ENCOUNTER — Telehealth: Payer: Self-pay | Admitting: *Deleted

## 2010-11-07 ENCOUNTER — Other Ambulatory Visit: Payer: Managed Care, Other (non HMO)

## 2010-11-07 DIAGNOSIS — M549 Dorsalgia, unspecified: Secondary | ICD-10-CM

## 2010-11-07 NOTE — Telephone Encounter (Signed)
Spoke with patient to notify him that you will print his rx on Monday for pick up on Tuesday 11/11/10. Patient will be in Tuesday to pick this up, fill out pain contract and have labs done Tuesday as well.

## 2010-11-07 NOTE — Telephone Encounter (Signed)
PT INFORMED

## 2010-11-11 ENCOUNTER — Other Ambulatory Visit: Payer: Managed Care, Other (non HMO)

## 2010-11-11 DIAGNOSIS — R7401 Elevation of levels of liver transaminase levels: Secondary | ICD-10-CM

## 2010-11-11 DIAGNOSIS — Z0289 Encounter for other administrative examinations: Secondary | ICD-10-CM

## 2010-11-11 DIAGNOSIS — D649 Anemia, unspecified: Secondary | ICD-10-CM

## 2010-11-11 DIAGNOSIS — E782 Mixed hyperlipidemia: Secondary | ICD-10-CM

## 2010-11-11 LAB — LIPID PANEL
HDL: 64 mg/dL (ref >39)
Total CHOL/HDL Ratio: 2.9 Ratio

## 2010-11-11 LAB — HEPATIC FUNCTION PANEL
AST: 46 U/L — ABNORMAL HIGH (ref 0–37)
Albumin: 4.4 g/dL (ref 3.5–5.2)
Total Bilirubin: 0.5 mg/dL (ref 0.3–1.2)

## 2010-11-11 MED ORDER — OXYCODONE HCL 30 MG PO TABS: 30.0000 mg | ORAL_TABLET | Freq: Four times a day (QID) | ORAL | Status: DC | PRN

## 2010-11-11 NOTE — Telephone Encounter (Signed)
Pain contract to be signed today--for #210 monthly.  NOT to be given early.  Please do urine drug screen today as part of protocol for chronic pain med patients (and advise pt that these may periodically be done, as per our protocol)

## 2010-11-11 NOTE — Telephone Encounter (Signed)
Addended by: Joselyn Arrow on: 11/11/2010 08:46 AM   Modules accepted: Orders

## 2010-11-12 LAB — CBC WITH DIFFERENTIAL/PLATELET
Eosinophils Relative: 2 % (ref 0–5)
HCT: 48.6 % (ref 39.0–52.0)
Hemoglobin: 16.5 g/dL (ref 13.0–17.0)
Lymphocytes Relative: 40 % (ref 12–46)
Lymphs Abs: 1.6 10*3/uL (ref 0.7–4.0)
MCV: 112 fL — ABNORMAL HIGH (ref 78.0–100.0)
Monocytes Absolute: 0.3 10*3/uL (ref 0.1–1.0)
Monocytes Relative: 6 % (ref 3–12)
RBC: 4.34 MIL/uL (ref 4.22–5.81)
WBC: 3.9 10*3/uL — ABNORMAL LOW (ref 4.0–10.5)

## 2010-11-12 LAB — DRUG SCREEN, URINE
Amphetamine Screen, Ur: NEGATIVE
Benzodiazepines.: POSITIVE — AB
Methadone: NEGATIVE
Propoxyphene: NEGATIVE

## 2010-11-17 ENCOUNTER — Telehealth: Payer: Self-pay | Admitting: *Deleted

## 2010-11-17 ENCOUNTER — Encounter: Payer: Self-pay | Admitting: Family Medicine

## 2010-11-17 NOTE — Telephone Encounter (Signed)
Left message for patient informing of him of his lab results. Discharge letter to mailed out today per Laureen Ochs.

## 2010-11-18 LAB — BASIC METABOLIC PANEL
BUN: 8
Calcium: 9.8
Creatinine, Ser: 0.95
GFR calc non Af Amer: >60
Potassium: 4.5

## 2010-11-18 LAB — HEMOGLOBIN AND HEMATOCRIT, BLOOD
HCT: 46.1
Hemoglobin: 16.1

## 2010-11-20 ENCOUNTER — Encounter: Payer: Self-pay | Admitting: Family Medicine

## 2010-11-20 ENCOUNTER — Other Ambulatory Visit: Payer: Self-pay | Admitting: Family Medicine

## 2010-11-20 DIAGNOSIS — D7589 Other specified diseases of blood and blood-forming organs: Secondary | ICD-10-CM | POA: Insufficient documentation

## 2010-11-26 ENCOUNTER — Emergency Department (HOSPITAL_COMMUNITY)
Admission: EM | Admit: 2010-11-26 | Discharge: 2010-11-26 | Disposition: A | Discharge status: Home / Self Care | Payer: Managed Care, Other (non HMO) | Attending: Emergency Medicine | Admitting: Emergency Medicine

## 2010-11-26 DIAGNOSIS — R112 Nausea with vomiting, unspecified: Secondary | ICD-10-CM | POA: Insufficient documentation

## 2010-11-26 DIAGNOSIS — R109 Unspecified abdominal pain: Secondary | ICD-10-CM | POA: Insufficient documentation

## 2010-11-26 DIAGNOSIS — F1010 Alcohol abuse, uncomplicated: Secondary | ICD-10-CM | POA: Insufficient documentation

## 2010-11-26 DIAGNOSIS — R10811 Right upper quadrant abdominal tenderness: Secondary | ICD-10-CM | POA: Insufficient documentation

## 2010-11-26 LAB — CBC
Platelets: 220 10*3/uL (ref 150–400)
RBC: 4.85 MIL/uL (ref 4.22–5.81)
WBC: 5 10*3/uL (ref 4.0–10.5)

## 2010-11-26 LAB — LIPASE, BLOOD: Lipase: 44 U/L (ref 11–59)

## 2010-11-26 LAB — PROTIME-INR: Prothrombin Time: 12.4 seconds (ref 11.6–15.2)

## 2010-11-26 LAB — APTT: aPTT: 31 seconds (ref 24–37)

## 2010-11-26 LAB — DIFFERENTIAL
Basophils Absolute: 0.1 10*3/uL (ref 0.0–0.1)
Basophils Relative: 1 % (ref 0–1)
Eosinophils Absolute: 0.1 10*3/uL (ref 0.0–0.7)
Neutrophils Relative %: 48 % (ref 43–77)

## 2010-11-26 LAB — HEPATIC FUNCTION PANEL
Bilirubin, Direct: 0.1 mg/dL (ref 0.0–0.3)
Indirect Bilirubin: 0 mg/dL — ABNORMAL LOW (ref 0.3–0.9)
Total Protein: 7.9 g/dL (ref 6.0–8.3)

## 2010-11-26 LAB — BASIC METABOLIC PANEL
Chloride: 94 mEq/L — ABNORMAL LOW (ref 96–112)
Creatinine, Ser: 0.72 mg/dL (ref 0.50–1.35)
GFR calc Af Amer: >90 mL/min (ref >90)
GFR calc non Af Amer: >90 mL/min (ref >90)
Potassium: 4 mEq/L (ref 3.5–5.1)

## 2010-11-26 LAB — ETHANOL: Alcohol, Ethyl (B): 238 mg/dL — ABNORMAL HIGH (ref 0–11)

## 2010-12-03 ENCOUNTER — Telehealth: Payer: Self-pay | Admitting: Internal Medicine

## 2010-12-03 NOTE — Telephone Encounter (Signed)
Pt went to ER 11/26/10 for abdominal pain. He reports he was told to see a specialist ASAP. He reports having sever abdominal pain in the top center of his abdomen radiating to the right where his liver is. He reports vomiting 10-20 x daily, but he was able to keep soup down today. 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 Pt stated his pain is constant and he only gets relief if he lies in bed in a fetal position.  Informed pt I will speak with Dr Rhea Belton and call him in the am.

## 2010-12-04 NOTE — Telephone Encounter (Signed)
Notified pt of his appt with Dr Rhea Belton 12/01/10 at 2pm. Mailed pt an assessment form and appt card.

## 2010-12-08 ENCOUNTER — Other Ambulatory Visit (HOSPITAL_COMMUNITY): Payer: Self-pay | Admitting: Orthopedic Surgery

## 2010-12-08 DIAGNOSIS — G8929 Other chronic pain: Secondary | ICD-10-CM

## 2010-12-11 ENCOUNTER — Ambulatory Visit (INDEPENDENT_AMBULATORY_CARE_PROVIDER_SITE_OTHER): Payer: Managed Care, Other (non HMO) | Admitting: Internal Medicine

## 2010-12-11 ENCOUNTER — Other Ambulatory Visit (INDEPENDENT_AMBULATORY_CARE_PROVIDER_SITE_OTHER): Payer: Managed Care, Other (non HMO)

## 2010-12-11 ENCOUNTER — Encounter: Payer: Self-pay | Admitting: Internal Medicine

## 2010-12-11 DIAGNOSIS — R197 Diarrhea, unspecified: Secondary | ICD-10-CM

## 2010-12-11 DIAGNOSIS — R110 Nausea: Secondary | ICD-10-CM

## 2010-12-11 DIAGNOSIS — R109 Unspecified abdominal pain: Secondary | ICD-10-CM

## 2010-12-11 DIAGNOSIS — K859 Acute pancreatitis without necrosis or infection, unspecified: Secondary | ICD-10-CM

## 2010-12-11 DIAGNOSIS — R1110 Vomiting, unspecified: Secondary | ICD-10-CM

## 2010-12-11 LAB — CBC WITH DIFFERENTIAL/PLATELET
Basophils Absolute: 0.1 10*3/uL (ref 0.0–0.1)
Eosinophils Relative: 2.4 % (ref 0.0–5.0)
HCT: 50.4 % (ref 39.0–52.0)
Lymphs Abs: 1.7 10*3/uL (ref 0.7–4.0)
Monocytes Relative: 8.9 % (ref 3.0–12.0)
Neutrophils Relative %: 43.9 % (ref 43.0–77.0)
Platelets: 204 10*3/uL (ref 150.0–400.0)
RDW: 13.2 % (ref 11.5–14.6)
WBC: 3.8 10*3/uL — ABNORMAL LOW (ref 4.5–10.5)

## 2010-12-11 LAB — COMPREHENSIVE METABOLIC PANEL
ALT: 36 U/L (ref 0–53)
Albumin: 4.4 g/dL (ref 3.5–5.2)
Alkaline Phosphatase: 74 U/L (ref 39–117)
CO2: 28 mEq/L (ref 19–32)
GFR: 102.82 mL/min (ref >60.00)
Potassium: 4.6 mEq/L (ref 3.5–5.1)
Sodium: 137 mEq/L (ref 135–145)
Total Bilirubin: 0.5 mg/dL (ref 0.3–1.2)
Total Protein: 7.8 g/dL (ref 6.0–8.3)

## 2010-12-11 LAB — LIPASE: Lipase: 38 U/L (ref 11.0–59.0)

## 2010-12-11 LAB — HEPATIC FUNCTION PANEL
ALT: 36 U/L (ref 0–53)
AST: 69 U/L — ABNORMAL HIGH (ref 0–37)
Alkaline Phosphatase: 74 U/L (ref 39–117)
Total Bilirubin: 0.5 mg/dL (ref 0.3–1.2)

## 2010-12-11 MED ORDER — TRAMADOL HCL 50 MG PO TABS: 50.0000 mg | ORAL_TABLET | Freq: Four times a day (QID) | ORAL | Status: DC | PRN

## 2010-12-11 MED ORDER — ONDANSETRON HCL 4 MG PO TABS: 4.0000 mg | ORAL_TABLET | Freq: Three times a day (TID) | ORAL | Status: DC | PRN

## 2010-12-11 MED ORDER — PROMETHAZINE HCL 12.5 MG RE SUPP: 12.5000 mg | Freq: Three times a day (TID) | RECTAL | Status: AC | PRN

## 2010-12-11 NOTE — Progress Notes (Signed)
Subjective:    Patient ID: Evan Keller, male    DOB: 1970-12-15, 40 y.o.   MRN: [id number]  HPI [name] is a 40 yo with PMH of hypertension, hypercholesterolemia, chronic low back pain, depression/anxiety, and history of chronic abdominal pain who is seen in consultation at the request of Dr. Lynelle Doctor for evaluation of nausea/vomiting, abdominal pain and diarrhea. The patient presents alone today. The patient states that he continues to have daily nausea and vomiting. He reports this is worse with eating. His emesis is yellowish in color and consists of food (if he has eaten), but no blood or coffee ground material. In the past he has used Zofran and Phenergan for this with some relief, but he has not been using this recently. He reports diarrhea which has been chronic for him, and going on for months to years. He reports 5-6 bowel movements a day which are liquid in character. He denies melena or rectal bleeding. He has also used Imodium in the past but has not done so recently. He doesn't remember if this helped significantly. Regarding his abdominal pain he reports that it is usually located across his upper abdomen but at times notes pain in the right upper quadrant and left lower quadrant. He is unsure what worsens the pain, but things eating may contribute. He denies heartburn, dysphagia or odynophagia. His appetite is fair, but limited by nausea. He denies early satiety. He reports an approximate 20 pound weight loss over the last 12 months and an approximate 60 pound weight loss over the last 2-3 years.  In the past he was taking AcipHex and protonix without marked improved.  He denies fevers chills or night sweats.  Of note the patient was seen in emergency department in the summer of 2012 and earlier in October for similar symptoms. He was told he had "hepatitis and pancreatitis". At that time he was drinking significant amounts of alcohol. He reports having stopped alcohol completely for  one month but did not get great improvement in his abdominal pain. At present he is still drinking and he estimates 4-8 beers daily. He denies wine or liquor use at present.  He has used oxycodone for chronic back pain which he was getting from his PCP, but he is currently trying to get established with the pain clinic. He is not on narcotics at present.   Review of Systems Constitutional: Negative for fever, chills, night sweats, activity change, appetite change and unexpected weight change.  Positive for fatigue HEENT: Negative for sore throat, mouth sores and trouble swallowing. Eyes: Negative for visual disturbance Respiratory: Negative for cough, chest tightness and shortness of breath Cardiovascular: Negative for chest pain, palpitations and lower extremity swelling Gastrointestinal: See history of present illness Genitourinary: Negative for dysuria and hematuria.  Positive for polyuria Musculoskeletal: Positive for chronic back pain and muscle cramps Skin: Negative for rash or color change Neurological: Negative for headaches, weakness, numbness Hematological: Negative for adenopathy, negative for easy bruising/bleeding Psychiatric/behavioral: Positive for depressed mood and for anxiety, positive for sleep difficulty  Patient Active Problem List  Diagnoses  . Depression  . Back pain, chronic  . Bee sting allergies  . ETOH abuse  . Pancreatitis  . Essential hypertension, benign  . Macrocytosis without anemia  --status post appendectomy May 2009, status post cholecystectomy Oct 2009  Current Outpatient Prescriptions  Medication Sig Dispense Refill  . ALPRAZolam (XANAX) 1 MG tablet Take 1 tablet (1 mg total) by mouth daily as needed.  30 tablet  0  . amLODipine (NORVASC) 5 MG tablet Take 1 tablet (5 mg total) by mouth daily.  90 tablet  1  . carisoprodol (SOMA) 350 MG tablet Take 1 tablet (350 mg total) by mouth 4 (four) times daily as needed.  120 tablet  0  . EPINEPHrine  (EPIPEN) 0.3 mg/0.3 mL DEVI Inject 0.3 mLs (0.3 mg total) into the muscle once.  2 Device  1  . sertraline (ZOLOFT) 50 MG tablet 1 tab a day  90 tablet  1  . zolpidem (AMBIEN) 10 MG tablet Take 1 tablet (10 mg total) by mouth at bedtime as needed.  30 tablet  0  . ondansetron (ZOFRAN) 4 MG tablet Take 1 tablet (4 mg total) by mouth every 8 (eight) hours as needed for nausea (as needed for Nausea and vomiting).  30 tablet  1  . promethazine (PHENERGAN) 12.5 MG suppository Place 1 suppository (12.5 mg total) rectally every 8 (eight) hours as needed for nausea (as needed for relief not given by Zofran).  12 each  0  . traMADol (ULTRAM) 50 MG tablet Take 1 tablet (50 mg total) by mouth every 6 (six) hours as needed for pain. Maximum dose= 8 tablets per day  60 tablet  1   Allergies  Allergen Reactions  . Ace Inhibitors Nausea And Vomiting  . Wellbutrin (Bupropion Hcl)     vomiting  . Bee Venom    Family History  Problem Relation Age of Onset  . Diabetes Mother   . Heart disease Father     aortic valve replacement, no CAD  . Heart disease Maternal Grandmother   . Melanoma Maternal Grandfather   . Lung cancer Paternal Grandfather   . Cancer Paternal Uncle     lung cancer, died at 41  --neg for GI tract malignancy   Social History  . Marital Status: Single   Occupational History  . account executive for [employer]    Social History Main Topics  . Smoking status: Current Some Day Smoker -- 0.5 packs/day    Types: Cigarettes  . Smokeless tobacco: Never Used   Comment: Socially  . Alcohol Use: See HPI 4-8 beers/daily     quit 08/2010  . Drug Use: No, denies     Objective:   Physical Exam BP 116/90  Pulse 76  Ht 6\' 3"  (1.905 m)  Wt 222 lb 12.8 oz (101.061 kg)  BMI 27.85 kg/m2 Constitutional: Well-developed and well-nourished. No distress. HEENT: Normocephalic and atraumatic. Oropharynx is clear and moist. No oropharyngeal exudate. Conjunctivae are normal. Pupils are equal  round and reactive to light. No scleral icterus. Neck: Neck supple. Trachea midline. Cardiovascular: Normal rate, regular rhythm and intact distal pulses. No M/R/G Pulmonary/chest: Effort normal and breath sounds normal. No wheezing, rales or rhonchi. Abdominal: Soft, diffuse tenderness to palpation with no rebound or guarding, nondistended. Bowel sounds active throughout. There are no masses palpable. Liver edge is palpable 2-3 cm below the right costal margin, no splenomegaly Extremities: no clubbing, cyanosis, or edema Lymphadenopathy: No cervical adenopathy noted. Neurological: Alert and oriented to person place and time. Skin: Skin is warm and dry. No rashes noted. Psychiatric: Normal mood and affect. Behavior is normal.  CMP     Component Value Date/Time   NA 133* 11/26/2010 1715   K 4.0 11/26/2010 1715   CL 94* 11/26/2010 1715   CO2 26 11/26/2010 1715   GLUCOSE 122* 11/26/2010 1715   BUN 14 11/26/2010 1715   CREATININE  0.72 11/26/2010 1715   CREATININE 0.71 10/13/2010 1607   CALCIUM 8.8 11/26/2010 1715   PROT 7.9 11/26/2010 1715   ALBUMIN 4.0 11/26/2010 1715   AST 56* 11/26/2010 1715   ALT 26 11/26/2010 1715   ALKPHOS 76 11/26/2010 1715   BILITOT 0.1* 11/26/2010 1715   GFRNONAA >90 11/26/2010 1715   GFRAA >90 11/26/2010 1715   CBC    Component Value Date/Time   WBC 5.0 11/26/2010 1715   RBC 4.85 11/26/2010 1715   HGB 18.6* 11/26/2010 1715   HCT 50.9 11/26/2010 1715   PLT 220 11/26/2010 1715   MCV 104.9* 11/26/2010 1715   MCH 38.4* 11/26/2010 1715   MCHC 36.5* 11/26/2010 1715   RDW 13.0 11/26/2010 1715   LYMPHSABS 2.0 11/26/2010 1715   MONOABS 0.5 11/26/2010 1715   EOSABS 0.1 11/26/2010 1715   BASOSABS 0.1 11/26/2010 1715   ETOH level Oct 2012 -- 238  Lipase 44 (Oct 2012); 66 and 146 (July 2012)  EGD/EUS -- performed by Dr. Dulce Sellar 08/21/2009 [place] CBD 8 mm and normal in appearance without thickening, stenosis or stones.  Hepatic steatosis. Normal ampulla.  Normal pancreas without changes of chronic pancreatitis. EGD normal to D2     Assessment & Plan:  40 yo with PMH of hypertension, hypercholesterolemia, chronic low back pain, depression/anxiety, and history of chronic abdominal pain who is seen in consultation at the request of Dr. Lynelle Doctor for evaluation of nausea/vomiting, abdominal pain and diarrhea.  1. N/V/abd pain -- the patient has a history of chronic abdominal pain and he has been evaluated by multiple physicians, including a gastroenterologist, on multiple occasions over the past few years.  He has had pain suspicious for pancreatitis in the past and also an elevated lipase in the setting of alcohol abuse. He is still using alcohol in excess and we have discussed this today. Certainly if there is any element of pancreatic inflammation alcohol cessation is the #1 recommendation for him. He is fully aware of my recommendation that he should cut back and even stop. His previous EGD and EUS are also reassuring. For now I will elect to repeat the CT scan of his abdomen and pelvis with special attention to his pancreas to rule out inflammation or any new process. I will also check labs including comp his metabolic panel, CBC, PT/INR, lipase and amylase. I will give him a prescription for Zofran ODT to be used for symptomatic nausea/vomiting control. Also will give him a prescription for Phenergan suppositories to be used for nausea and vomiting not relieved by Zofran. Zofran will be his first line agent. Finally I will give him a prescription for tramadol 50 mg every 6 hours when necessary pain.  We discussed that I do not think narcotics should be used for chronic abdominal pain.    2. History of mild AST/ALT elevation -- possibly alcohol induced hepatitis/inflammation. Will repeat hepatitis viral studies today. Will check liver enzymes today.  3. Diarrhea -- this is a long-standing problem for him at this point, but stool studies have not been done  recently. I will start by ordering stool culture, C. difficile, ova and parasite, and fecal elastase + fecal fat).  The differential includes infection, inflammation, bile salt diarrhea after cholecystectomy, pancreatic insufficiency, and IBS.  He did have focal area of possible sigmoid inflammation on old CT scan, but this also could have been from under distention. A CT scan ordered today we'll again evaluate the colon. If the stool studies are unrevealing, we  may proceed with colonoscopy to better evaluate his chronic diarrhea.  Return in 1 month

## 2010-12-11 NOTE — Patient Instructions (Addendum)
You have been given a separate informational sheet regarding your tobacco use, the importance of quitting and local resources to help you quit.  We have sent the following medications to your pharmacy for you to pick up at your convenience: tramadol, Zofran and Phenergan.   Your physician has requested that you go to the basement for the following lab work before leaving today:  Dr. Rhea Belton would like you to cease all alcohol intake.   You have been scheduled for a CT scan of the abdomen and pelvis at [facility] ([address]---this is in the same building as Architectural technologist).   You are scheduled on 12/15/2010 at 1:00pm. You should arrive 15 minutes prior to your appointment time for registration. Please follow the written instructions below on the day of your exam:  WARNING: IF YOU ARE ALLERGIC TO IODINE/X-RAY DYE, PLEASE NOTIFY RADIOLOGY IMMEDIATELY AT [phone]! YOU WILL BE GIVEN A 13 HOUR PREMEDICATION PREP.  1) Do not eat or drink anything after 9:00am (4 hours prior to your test) 2) You have been given 2 bottles of oral contrast to drink. The solution may taste better if refrigerated, but do NOT add ice or any other liquid to this solution. Shake well before drinking.    Drink 1 bottle of contrast @ 11:00am (2 hours prior to your exam)  Drink 1 bottle of contrast @ 12:00pm (1 hour prior to your exam)  You may take any medications as prescribed with a small amount of water except for the following: Metformin, Glucophage, Glucovance, Avandamet, Riomet, Fortamet, Actoplus Met, Janumet, Glumetza or Metaglip. The above medications must be held the day of the exam AND 48 hours after the exam.  The purpose of you drinking the oral contrast is to aid in the visualization of your intestinal tract. The contrast solution may cause some diarrhea. Before your exam is started, you will be given a small amount of fluid to drink. Depending on your individual set of symptoms, you  may also receive an intravenous injection of x-ray contrast/dye. Plan on being at [facility] for 30 minutes or long, depending on the type of exam you are having performed.  If you have any questions regarding your exam or if you need to reschedule, you may call the CT department at [phone] between the hours of 8:00 am and 5:00 pm, Monday-Friday.  ________________________________________________________________________

## 2010-12-12 ENCOUNTER — Telehealth: Payer: Self-pay | Admitting: *Deleted

## 2010-12-12 DIAGNOSIS — E538 Deficiency of other specified B group vitamins: Secondary | ICD-10-CM

## 2010-12-12 DIAGNOSIS — D649 Anemia, unspecified: Secondary | ICD-10-CM

## 2010-12-12 LAB — HEPATITIS B SURFACE ANTIBODY,QUALITATIVE: Hep B S Ab: NEGATIVE

## 2010-12-12 LAB — HEPATITIS A ANTIBODY, TOTAL: Hep A Total Ab: POSITIVE — AB

## 2010-12-12 NOTE — Telephone Encounter (Signed)
Message copied by Florene Glen on Fri Dec 12, 2010 10:07 AM ------      Message from: Beverley Fiedler      Created: Thu Dec 11, 2010  5:16 PM      Regarding: lab add on       Can you please try to add a B12/folate level onto the patient's labs from 10/25? thanks

## 2010-12-12 NOTE — Telephone Encounter (Signed)
Faxed an order to the lab to add on b12 and folate level; verifed they received the order.

## 2010-12-12 NOTE — Telephone Encounter (Signed)
lmom for pt to call back. Per [place], the CT is still under review and we may not have approval by 12/15/10.

## 2010-12-12 NOTE — Telephone Encounter (Signed)
Called pt to inform him his CT scan for 12/15/10 has not been approved and to give [place] more time, he may want to r/s. Pt had a hard time understanding this and he couldn't believe his insurance would not pay. After a rather long conversation he agreed to change his appt to 12/16/10 at 1:30am; NPO after 0930am; drink 1 bottle of contrast at 11:30, the 2nd at 12:30pm. He has a BONE SCAN also scheduled  for 10/3012and he is willing to change that appt because he needs the CT scan and " it's more important". Pt instructed to call [phone] to change the NM appt. He will call [employer] to see why the scan has not been approved.

## 2010-12-15 ENCOUNTER — Telehealth: Payer: Self-pay | Admitting: Internal Medicine

## 2010-12-15 LAB — FOLATE: Folate: 3.5 ng/mL — ABNORMAL LOW (ref >5.9)

## 2010-12-15 NOTE — Telephone Encounter (Signed)
Informed pt he needs to call his insurance company for the pre auth. Release for his CT scan; call [phone], Case # [id number]. I had called [phone] and no peer to peer needed to be done.  Pt stated he has no appetite and wants to know if there's anything Dr Rhea Belton can order for his appetite. He states he only eats 2 days a week. He has lost from 280 lbs to around 220lbs.  He can't take steroids d/t they make his bp go up. Also, the Tramadol isn't helping his pain; he even "loaded up" with 3 or 4 pills at a time w/o help. Explained to pt, our office usually doesn't order narcotics for pain and he stated he wasn't asking for narcotics, just something stronger than the ultram.  Thanks.

## 2010-12-16 ENCOUNTER — Encounter (HOSPITAL_COMMUNITY): Payer: Managed Care, Other (non HMO)

## 2010-12-16 ENCOUNTER — Ambulatory Visit (HOSPITAL_COMMUNITY): Payer: Managed Care, Other (non HMO)

## 2010-12-16 ENCOUNTER — Ambulatory Visit: Payer: Managed Care, Other (non HMO)

## 2010-12-16 ENCOUNTER — Encounter: Payer: Self-pay | Admitting: Internal Medicine

## 2010-12-16 ENCOUNTER — Ambulatory Visit (INDEPENDENT_AMBULATORY_CARE_PROVIDER_SITE_OTHER)
Admission: RE | Admit: 2010-12-16 | Discharge: 2010-12-16 | Disposition: A | Discharge status: Home / Self Care | Payer: Managed Care, Other (non HMO) | Source: Ambulatory Visit | Attending: Internal Medicine | Admitting: Internal Medicine

## 2010-12-16 DIAGNOSIS — R109 Unspecified abdominal pain: Secondary | ICD-10-CM

## 2010-12-16 DIAGNOSIS — K859 Acute pancreatitis without necrosis or infection, unspecified: Secondary | ICD-10-CM

## 2010-12-16 DIAGNOSIS — R1110 Vomiting, unspecified: Secondary | ICD-10-CM

## 2010-12-16 DIAGNOSIS — R110 Nausea: Secondary | ICD-10-CM

## 2010-12-16 DIAGNOSIS — R197 Diarrhea, unspecified: Secondary | ICD-10-CM

## 2010-12-16 MED ORDER — IOHEXOL 300 MG/ML  SOLN
100.0000 mL | Freq: Once | INTRAMUSCULAR | Status: AC | PRN
  Administered 2010-12-16: 100 mL via INTRAVENOUS

## 2010-12-16 NOTE — Telephone Encounter (Signed)
Error

## 2010-12-16 NOTE — Telephone Encounter (Signed)
I agree that narcotics are not the best option for Evan Keller. He can take tramadol 100 mg q6h, but 400 mg is the max daily dose.  He needs to be advised to take meds as Rx'ed or we will no longer be able to rx meds for him. Appetite stimulant is not ideal at present. Want to complete the workup started and discussed at our last visit before making decisions on further therapy. Thank you.

## 2010-12-18 ENCOUNTER — Telehealth: Payer: Self-pay | Admitting: Internal Medicine

## 2010-12-18 DIAGNOSIS — R630 Anorexia: Secondary | ICD-10-CM

## 2010-12-18 MED ORDER — FOLIC ACID 1 MG PO TABS: 1.0000 mg | ORAL_TABLET | Freq: Every day | ORAL | Status: DC

## 2010-12-18 NOTE — Telephone Encounter (Signed)
Informed pt of his lab results and that his CT scan showed no active or acute problems in the abdomen per the Radiologist, however he does have a fatty liver. Discussed Dr Lauro Franklin findings on labs and that he needs to take Folic Acid, 1mg  daily. I also advised him of the need to follow instructions on his meds; he can increase his Tramadol to 100mg  q6hr. Dr Rhea Belton doesn't want to order anything for his appetite at this time. Pt reports he has only eaten 2 things since Sunday. He tried to eat a piece of bread yesterday and became nauseated and threw up all afternoon. Pt delayed starting his new job from yesterday to next Monday. He states he has to get some strength and be able to eat for this. He saw something for appetite stimulation on the internet; again, I informed him I will ask Dr Rhea Belton .  Dr Rhea Belton, please advise about  Appetite and CT scan results. Thanks.

## 2010-12-18 NOTE — Telephone Encounter (Signed)
Message copied by Florene Glen on Thu Dec 18, 2010  3:35 PM ------      Message from: Beverley Fiedler      Created: Mon Dec 15, 2010 12:17 PM       Please notify of labs.  Hep A immune, B and C negative.      AST up slightly and I think this is ETOH related.  We discussed cutting back and goal of stopping.      Continue will plan made in clinic.

## 2010-12-19 ENCOUNTER — Telehealth: Payer: Self-pay | Admitting: Family Medicine

## 2010-12-19 NOTE — Telephone Encounter (Signed)
Pt called again late last night and left message on my voice mail advising Dr. Nilsa Nutting at [facility] advised him that Dr. Lynelle Doctor would need to refer him to another pain management clinic.  I have today called pt and reached voice mail advising him that we are doing nothing further.  He needs to go ahead and get PCP for his care.  Also advised if needed go to ER.  We need no further phone conversation or faxes.  Chart closed. Laureen Ochs

## 2010-12-19 NOTE — Telephone Encounter (Signed)
I had a 20 minute phone conversation with Dr. Nilsa Nutting on 12/11/10 regarding the care of Mr. Petzold.  Apparently the first visit at their office was 8/20 (a week before visit with me).  At the time, he had been running low on pain meds, and had self-weaned down to 4/day.  Urine drug screen (in office and confirmatory test) was negative.  He had rx'd #120 at that visit, to last for an entire month, maintaining at the same 4/day that he reportedly was taking.  A week later he got #210 from me, as he never told me about visit and rx from pain clinic.  He was seen again by Dr. Laury Axon 3-4 days prior to phone conversation.  He again had a negative urine drug screen in the office, and that the confirmatory test was pending. He gave the patient a week of suboxone while waiting for confirmatory test, to prevent withdrawals.  He states that 2 negative urine screens "speaks for diversion" and that he will not be seeing this patient any longer, discharging patient from pain clinic.  (At first he stated that if urine confirmatory test came back positive, he would still be able to return for weekly suboxone rx's, wouldn't rx more than weekly as there is a market for diverting suboxone also.  He later stated that he was planning to discharge patient regardless of urine results).  He suspects diversion of meds with negative urine drug screens (with no metabolites showing either).  Patient has been sending my office faxes in a panic, regarding where/how will he get his meds, he will have withdrawals, lose his job, etc.  Dr. Nilsa Nutting stated that if he is diverting meds, then there is no risk for withdrawal, and to advise him to go to ER for any withdrawal symptoms.  I had called Dr. Nilsa Nutting after receiving a fax dated 10/24 from patient, which stated that the doctor at [facility] said "the right thing to do is have [facility] Arrow refill your medicine".  Dr. Nilsa Nutting denies ever stating this.  He also states in that fax he "took a urine test today at his  office and it showed positive for oxycodone (I have a copy I can fax) even though I have been out for two days".  I did not ask patient to fax these results, which directly contradict Dr. Charlann Boxer verbal report to me during this phone call, as it would make no difference in my care to this patient.  He has been discharged and I will not be giving him an "emergency supply" of pain meds as he was requesting. He also states that he would not be taking the Suboxone as it made him feel "somewhat suicidal and crazy".  Patient's faxes will be scanned in to system.

## 2010-12-21 NOTE — Telephone Encounter (Signed)
See forwarded note for CT results. Regarding appetite stimulation, ETOH avoidance again is #1.  If there is ETOH-induced pancreas irritation (again below the level of detection by CT), then this certainly could affect appetite and his ability to eat. We can try mirtazapine 15 mg po qHS.  This should be taken at bedtime as it can cause somnolence, but it has been proven to increase appetite significantly. Cont narc avoidance. Thanks.

## 2010-12-23 ENCOUNTER — Telehealth: Payer: Self-pay | Admitting: Internal Medicine

## 2010-12-23 MED ORDER — MIRTAZAPINE 15 MG PO TABS: 15.0000 mg | ORAL_TABLET | Freq: Every day | ORAL | Status: DC

## 2010-12-23 NOTE — Telephone Encounter (Signed)
Combination of zoloft and mirtazapine does care increased risk of serotonin syndrome, so we likely should avoid it.  This leaves little option for appetite stimulation at present. We can discuss this further at our next visit.

## 2010-12-23 NOTE — Telephone Encounter (Signed)
See note from 12/19/10

## 2010-12-23 NOTE — Telephone Encounter (Signed)
Informed pt that he is correct in that the combination of the 2 drugs could increase the risk of serotonin syndrome so we will avoid it. This leaves little option for an appetite stimulant. Pt replied the internet has tons of meds listed for appetite stimulation. Informed him that I would be happy to suggest a drug to Dr Rhea Belton if he has one, but I am too busy and Dr Rhea Belton is at the hospital, then Dr Rhea Belton can review it. Pt started arguing that he wasn't a doctor and we should know what to do to help him. I explained he was the one who stated the internet had plenty of suggestions. Pt argued on and on and I finally stated he could call me back with a suggestion.

## 2010-12-23 NOTE — Telephone Encounter (Signed)
Notified pt of Dr Lauro Franklin findings and recommendation for Remeron; ordered med. Also, stool results have not been received, called [facility] who will fax the results.  Notified pt the stool labs were negative. Pt then stated the Remeron was an anti depressant and he's already on Zoloft. Ok to take Dr Rhea Belton?

## 2011-01-05 ENCOUNTER — Telehealth: Payer: Self-pay

## 2011-01-05 NOTE — Telephone Encounter (Signed)
Patient has sent a fax with c/o pain.   I have left him a message asking him to call the office to discuss his letter and abdominal pain.  I have also asked in this message that he not send faxes to the MD, if he has questions or problems to please call the office and ask to speak with the triage nurse.

## 2011-01-07 NOTE — Telephone Encounter (Signed)
Spoke with Dr Rhea Belton about the fax from pt; Dr Rhea Belton is on vacation. Dr Rhea Belton cannot admit a pt w/o seeing them and he is on vacation. Pt can go to the ER if he feels he needs to. Dr Rhea Belton stated multiple tests have been done which have been normal. The only thing known is he has a fatty liver and documented ETOH use. Dr Dulce Sellar with [facility] did an EGD and EUS,all unremarkable. Dr Dulce Sellar felt like his problem is functional, meaning the pain is from the way his gut works. Dr Rhea Belton feels as though that is the problem as well and we will be able to only manage his signs and symptoms. He needs to stop drinking if he hasn't already. Dr Rhea Belton can see him Monday, 01/12/11 at 11am. lmom for pt to call back and he needs to give [place] symptoms to document in the computer.

## 2011-01-07 NOTE — Telephone Encounter (Signed)
Informed pt of Dr Lauro Franklin findings/ recommendations. Pt reports non stop pain that is " crippling" causing him to miss work and he vomits daily. The pain is located in the upper abdomen, below the sternum down to his navel. He has no appetite and if he eats, he usually vomits. I asked if he's lost weight and he stated he hasn't weighed. He asks if it could be IBS? He denies a temp, but sometimes he wakes up in a cold sweat; he has no energy and curls up in bed when not trying to work. He compares the pain to appendicitis, but not as bad. If he bends over, he get pain in his " liver" that goes away when he straightens back up. Pt will see Dr Rhea Belton on Monday; he will call or go to the ER until then.

## 2011-01-12 ENCOUNTER — Other Ambulatory Visit (INDEPENDENT_AMBULATORY_CARE_PROVIDER_SITE_OTHER): Payer: Self-pay

## 2011-01-12 ENCOUNTER — Encounter: Payer: Self-pay | Admitting: Internal Medicine

## 2011-01-12 ENCOUNTER — Ambulatory Visit: Payer: Self-pay

## 2011-01-12 ENCOUNTER — Ambulatory Visit (INDEPENDENT_AMBULATORY_CARE_PROVIDER_SITE_OTHER): Payer: Self-pay | Admitting: Internal Medicine

## 2011-01-12 DIAGNOSIS — R197 Diarrhea, unspecified: Secondary | ICD-10-CM

## 2011-01-12 DIAGNOSIS — R110 Nausea: Secondary | ICD-10-CM

## 2011-01-12 DIAGNOSIS — R1110 Vomiting, unspecified: Secondary | ICD-10-CM

## 2011-01-12 DIAGNOSIS — D649 Anemia, unspecified: Secondary | ICD-10-CM

## 2011-01-12 DIAGNOSIS — K859 Acute pancreatitis without necrosis or infection, unspecified: Secondary | ICD-10-CM

## 2011-01-12 DIAGNOSIS — R109 Unspecified abdominal pain: Secondary | ICD-10-CM

## 2011-01-12 DIAGNOSIS — E538 Deficiency of other specified B group vitamins: Secondary | ICD-10-CM | POA: Insufficient documentation

## 2011-01-12 LAB — COMPREHENSIVE METABOLIC PANEL
ALT: 58 U/L — ABNORMAL HIGH (ref 0–53)
AST: 70 U/L — ABNORMAL HIGH (ref 0–37)
Alkaline Phosphatase: 226 U/L — ABNORMAL HIGH (ref 39–117)
Creatinine, Ser: 0.7 mg/dL (ref 0.4–1.5)
GFR: 143.24 mL/min (ref >60.00)
Sodium: 124 mEq/L — ABNORMAL LOW (ref 135–145)
Total Bilirubin: 2.3 mg/dL — ABNORMAL HIGH (ref 0.3–1.2)

## 2011-01-12 LAB — VITAMIN B12: Vitamin B-12: 613 pg/mL (ref 211–911)

## 2011-01-12 LAB — FOLATE: Folate: >24.8 ng/mL (ref >5.9)

## 2011-01-12 MED ORDER — TRAMADOL HCL 50 MG PO TABS: 50.0000 mg | ORAL_TABLET | Freq: Four times a day (QID) | ORAL | Status: DC | PRN

## 2011-01-12 MED ORDER — DRONABINOL 2.5 MG PO CAPS: 2.5000 mg | ORAL_CAPSULE | Freq: Two times a day (BID) | ORAL | Status: DC

## 2011-01-12 MED ORDER — ONDANSETRON HCL 4 MG/5ML PO SOLN: 4.0000 mg | Freq: Once | ORAL | Status: AC
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
## 2011-01-12 NOTE — Patient Instructions (Addendum)
  You have been scheduled for an endoscopy. Please follow written instructions given to you at your visit today.   Your physician has requested that you go to the basement for the following lab work before leaving today: CBC, CMet, Lipase  We have sent the following medications to your pharmacy for you to pick up at your convenience:Zofran, tramadol, and Dronabinol  You have been scheduled for a gastric emptying scan on 01/23/11 @ 1pm at [facility]. Nothing to eat or drink after 6 am the morning of your procedure. If you cannot make this appointment please call the Radiology Dept. At [phone].

## 2011-01-13 ENCOUNTER — Telehealth: Payer: Self-pay | Admitting: *Deleted

## 2011-01-13 NOTE — Telephone Encounter (Signed)
Please repeat basic metabolic panel and hepatic function panel today. This sample was hemolyzed making results difficult to interpret. Thanks.  lmom for pt to call back.

## 2011-01-14 ENCOUNTER — Other Ambulatory Visit: Payer: Self-pay | Admitting: Internal Medicine

## 2011-01-14 ENCOUNTER — Other Ambulatory Visit (INDEPENDENT_AMBULATORY_CARE_PROVIDER_SITE_OTHER): Payer: Self-pay

## 2011-01-14 ENCOUNTER — Ambulatory Visit: Payer: Self-pay

## 2011-01-14 DIAGNOSIS — R197 Diarrhea, unspecified: Secondary | ICD-10-CM

## 2011-01-14 DIAGNOSIS — R109 Unspecified abdominal pain: Secondary | ICD-10-CM

## 2011-01-14 DIAGNOSIS — R1110 Vomiting, unspecified: Secondary | ICD-10-CM

## 2011-01-14 DIAGNOSIS — K859 Acute pancreatitis without necrosis or infection, unspecified: Secondary | ICD-10-CM

## 2011-01-14 DIAGNOSIS — R110 Nausea: Secondary | ICD-10-CM

## 2011-01-14 LAB — BASIC METABOLIC PANEL
Calcium: 9.2 mg/dL (ref 8.4–10.5)
Creatinine, Ser: 0.8 mg/dL (ref 0.4–1.5)
GFR: 121.72 mL/min (ref >60.00)
Sodium: 128 mEq/L — ABNORMAL LOW (ref 135–145)

## 2011-01-14 LAB — HEPATIC FUNCTION PANEL
Alkaline Phosphatase: 267 U/L — ABNORMAL HIGH (ref 39–117)
Bilirubin, Direct: 0.7 mg/dL — ABNORMAL HIGH (ref 0.0–0.3)
Total Bilirubin: 1.1 mg/dL (ref 0.3–1.2)
Total Protein: 6.1 g/dL (ref 6.0–8.3)

## 2011-01-14 LAB — CBC WITH DIFFERENTIAL/PLATELET

## 2011-01-14 NOTE — Progress Notes (Signed)
Lab called and they needed a new order for a cbc that was hemolysed

## 2011-01-15 ENCOUNTER — Telehealth: Payer: Self-pay | Admitting: *Deleted

## 2011-01-15 LAB — CBC WITH DIFFERENTIAL/PLATELET

## 2011-01-15 NOTE — Telephone Encounter (Signed)
Message copied by Florene Glen on Thu Jan 15, 2011  1:28 PM ------      Message from: Beverley Fiedler      Created: Thu Jan 15, 2011 12:24 PM       Labs reviewed, hyponatremia likely due to volume depletion in the setting of nausea and vomiting. His liver function tests reveal elevation which is a change. His bilirubin is up slightly though trending down as well as his alkaline phosphatase. AST and ALT are both elevated as well. I like to proceed with MRCP to evaluate his biliary ductal system. He may need ERCP or even biliary manometry.      Patient will attempt to orally rehydrate himself and has deferred admission at this time. He will call us back and update [place] with his progress. MRCP scheduled for tomorrow night

## 2011-01-15 NOTE — Telephone Encounter (Signed)
Notified pt that the MRCP is scheduled for tomorrow at 8pm and I gave his prep instructions. He drank water after we talked and was able to keep it down so far. He took Marinol and hopes to eat and drink later. He will call tomorrow if he feels as though he's still dehydrated.

## 2011-01-15 NOTE — Telephone Encounter (Signed)
Pt lmom after lunch that he was trying to drink and he hopes to have an appetite soon. He will call for further problems.

## 2011-01-15 NOTE — Telephone Encounter (Signed)
Spoke with pt to assess his hydration status d/t low NA and elevated LFT's on recent labs. He reports he has already thrown up this am and threw up yesterday; reports he's better with the new anti emetic, Marinol, but he hasn't taken it at work because it makes him drowsy. Informed him he needs an MRCP and I can schedule it tomorrow night or the weekend, but he needs to be hydrated. If he can't drink liquids for hydration, he may need to be admitted. Pt is on a conference call which will end at 11am and he'll buy Gatorade to see if it helps; he'll call back.  Scheduled MRCP for 01/16/11 at 8pm, arrive 7:45pm, NPO 4 hours prior.

## 2011-01-16 ENCOUNTER — Ambulatory Visit (HOSPITAL_COMMUNITY)
Admission: RE | Admit: 2011-01-16 | Discharge: 2011-01-16 | Disposition: A | Discharge status: Home / Self Care | Payer: Managed Care, Other (non HMO) | Source: Ambulatory Visit | Attending: Internal Medicine | Admitting: Internal Medicine

## 2011-01-16 ENCOUNTER — Other Ambulatory Visit: Payer: Self-pay | Admitting: Internal Medicine

## 2011-01-16 DIAGNOSIS — K7689 Other specified diseases of liver: Secondary | ICD-10-CM | POA: Insufficient documentation

## 2011-01-16 DIAGNOSIS — R7989 Other specified abnormal findings of blood chemistry: Secondary | ICD-10-CM | POA: Insufficient documentation

## 2011-01-16 DIAGNOSIS — R634 Abnormal weight loss: Secondary | ICD-10-CM | POA: Insufficient documentation

## 2011-01-16 DIAGNOSIS — R1110 Vomiting, unspecified: Secondary | ICD-10-CM

## 2011-01-16 DIAGNOSIS — R109 Unspecified abdominal pain: Secondary | ICD-10-CM | POA: Insufficient documentation

## 2011-01-16 DIAGNOSIS — Z9089 Acquired absence of other organs: Secondary | ICD-10-CM | POA: Insufficient documentation

## 2011-01-16 MED ORDER — GADOBENATE DIMEGLUMINE 529 MG/ML IV SOLN
20.0000 mL | Freq: Once | INTRAVENOUS | Status: AC | PRN
  Administered 2011-01-16: 20 mL via INTRAVENOUS

## 2011-01-19 ENCOUNTER — Encounter: Payer: Self-pay | Admitting: Internal Medicine

## 2011-01-19 ENCOUNTER — Telehealth: Payer: Self-pay

## 2011-01-19 NOTE — Telephone Encounter (Signed)
Dr Rhea Belton the pt is asking for the letter to be faxed to the above number.  He says you discussed this with him at his office visit

## 2011-01-19 NOTE — Telephone Encounter (Signed)
Letter done and routed to you. Thanks [employer]

## 2011-01-19 NOTE — Telephone Encounter (Signed)
Letter faxed per pt request.

## 2011-01-23 ENCOUNTER — Telehealth: Payer: Self-pay | Admitting: Internal Medicine

## 2011-01-23 ENCOUNTER — Encounter (HOSPITAL_COMMUNITY): Payer: Self-pay

## 2011-01-23 ENCOUNTER — Other Ambulatory Visit (INDEPENDENT_AMBULATORY_CARE_PROVIDER_SITE_OTHER): Payer: 59

## 2011-01-23 ENCOUNTER — Ambulatory Visit (HOSPITAL_COMMUNITY)
Admission: RE | Admit: 2011-01-23 | Discharge: 2011-01-23 | Disposition: A | Discharge status: Home / Self Care | Payer: Managed Care, Other (non HMO) | Source: Ambulatory Visit | Attending: Internal Medicine | Admitting: Internal Medicine

## 2011-01-23 DIAGNOSIS — R110 Nausea: Secondary | ICD-10-CM

## 2011-01-23 DIAGNOSIS — K859 Acute pancreatitis without necrosis or infection, unspecified: Secondary | ICD-10-CM

## 2011-01-23 DIAGNOSIS — R197 Diarrhea, unspecified: Secondary | ICD-10-CM

## 2011-01-23 DIAGNOSIS — R17 Unspecified jaundice: Secondary | ICD-10-CM

## 2011-01-23 DIAGNOSIS — R1110 Vomiting, unspecified: Secondary | ICD-10-CM

## 2011-01-23 DIAGNOSIS — R109 Unspecified abdominal pain: Secondary | ICD-10-CM | POA: Insufficient documentation

## 2011-01-23 LAB — BASIC METABOLIC PANEL
BUN: 11 mg/dL (ref 6–23)
CO2: 26 mEq/L (ref 19–32)
GFR: 111.49 mL/min (ref >60.00)
Glucose, Bld: 125 mg/dL — ABNORMAL HIGH (ref 70–99)
Potassium: 3.9 mEq/L (ref 3.5–5.1)
Sodium: 130 mEq/L — ABNORMAL LOW (ref 135–145)

## 2011-01-23 LAB — HEPATIC FUNCTION PANEL
ALT: 253 U/L — ABNORMAL HIGH (ref 0–53)
AST: 486 U/L — ABNORMAL HIGH (ref 0–37)
Bilirubin, Direct: 4.3 mg/dL — ABNORMAL HIGH (ref 0.0–0.3)
Total Bilirubin: 7.3 mg/dL — ABNORMAL HIGH (ref 0.3–1.2)

## 2011-01-23 LAB — CBC WITH DIFFERENTIAL/PLATELET
HCT: 44.9 % (ref 39.0–52.0)
Hemoglobin: 16.1 g/dL (ref 13.0–17.0)
MCV: 97.7 fl (ref 78.0–100.0)
Platelets: 135 10*3/uL — ABNORMAL LOW (ref 150.0–400.0)
RDW: 16.3 % — ABNORMAL HIGH (ref 11.5–14.6)
WBC: 7.1 10*3/uL (ref 4.5–10.5)

## 2011-01-23 LAB — LIPASE: Lipase: 59 U/L (ref 11.0–59.0)

## 2011-01-23 MED ORDER — TECHNETIUM TC 99M SULFUR COLLOID
2.0000 | Freq: Once | INTRAVENOUS | Status: AC | PRN
  Administered 2011-01-23: 2 via ORAL

## 2011-01-23 NOTE — Telephone Encounter (Signed)
Left message on machine to call back  

## 2011-01-23 NOTE — Telephone Encounter (Signed)
I am in the hospital this week, so I cannot see him today. Likely best thing for him to do is to go to the ED for evaluation of jaundice. Please check basic met panel, hepatic function panel, CBC, lipase, and INR to get things started. THanks

## 2011-01-23 NOTE — Telephone Encounter (Signed)
Dr Rhea Belton saw the pt at [facility] when the pt was having his GES.  He advised the pt to have labs when finished and we will call him with results.  The labs were ordered STAT

## 2011-01-23 NOTE — Telephone Encounter (Signed)
Dr Rhea Belton the pt is having his GES today but also is complaining of being jaundice,  He wants to know what should he do?

## 2011-01-26 ENCOUNTER — Telehealth: Payer: Self-pay | Admitting: Internal Medicine

## 2011-01-26 NOTE — Telephone Encounter (Signed)
Repeat labs tomorrow morning urgent: CBC, basic metabolic panel, hepatic function panel, INR Please ask him if he is drinking alcohol and if so stop immediately Make him aware if labs worse he'll need admission

## 2011-01-26 NOTE — Telephone Encounter (Signed)
Gastric emptying study showed some delayed gastric emptying. This means that when he eats does empty his stomach more slowly, and certainly may contribute to his nausea and vomiting. His liver numbers were elevated, with his AST/ALT being elevated indicating possible alcohol use?? Bilirubin also elevated, but recent MRCP unremarkable indicating that the elevated bilirubin is likely more related to liver cell injury We need to repeat the labs mid week and if not significantly improving, he may need a liver biopsy He needs to absolutely abstain from all alcohol.

## 2011-01-26 NOTE — Telephone Encounter (Signed)
Informed pt of Dr Lauro Franklin findings and recommendations. Pt reports his juandice is worse; his eyes are more yellow and his urine is very dark and has a foul odor. He admits he has only drank a cup of coffee and a glass of water so far today at 4pm. Pt admits he drank more Gatorade before the last labs. He also reports his pain is now radiating around to his back. His nausea is better. EGD is 01/28/11. Any advice? Thanks.

## 2011-01-27 ENCOUNTER — Telehealth: Payer: Self-pay | Admitting: *Deleted

## 2011-01-27 ENCOUNTER — Other Ambulatory Visit (INDEPENDENT_AMBULATORY_CARE_PROVIDER_SITE_OTHER): Payer: Commercial Managed Care - PPO

## 2011-01-27 ENCOUNTER — Other Ambulatory Visit: Payer: Self-pay | Admitting: Internal Medicine

## 2011-01-27 DIAGNOSIS — R7989 Other specified abnormal findings of blood chemistry: Secondary | ICD-10-CM

## 2011-01-27 DIAGNOSIS — R109 Unspecified abdominal pain: Secondary | ICD-10-CM

## 2011-01-27 LAB — CBC WITH DIFFERENTIAL/PLATELET
HCT: 40 % (ref 39.0–52.0)
Hemoglobin: 14.3 g/dL (ref 13.0–17.0)
MCHC: 35.7 g/dL (ref 30.0–36.0)
MCV: 95.9 fl (ref 78.0–100.0)
Platelets: 111 10*3/uL — ABNORMAL LOW (ref 150.0–400.0)
RDW: 17.2 % — ABNORMAL HIGH (ref 11.5–14.6)

## 2011-01-27 LAB — HEPATIC FUNCTION PANEL
Albumin: 3 g/dL — ABNORMAL LOW (ref 3.5–5.2)
Alkaline Phosphatase: 450 U/L — ABNORMAL HIGH (ref 39–117)
Total Protein: 6.4 g/dL (ref 6.0–8.3)

## 2011-01-27 LAB — PROTIME-INR
INR: 0.9 ratio (ref 0.8–1.0)
Prothrombin Time: 10.4 s (ref 10.2–12.4)

## 2011-01-27 LAB — BASIC METABOLIC PANEL
BUN: 10 mg/dL (ref 6–23)
Calcium: 8.7 mg/dL (ref 8.4–10.5)
Creatinine, Ser: 0.9 mg/dL (ref 0.4–1.5)
GFR: 102.76 mL/min (ref >60.00)

## 2011-01-27 NOTE — Telephone Encounter (Signed)
Faxed an order to our lab to add on: ANA, IgG, Ceruloplasmin, Hemachromatosis, Anti SMA-ab, Anti Mitochrondrial, Ferritin, %Sat, Alpha-1 antitrypsin, CDT.

## 2011-01-27 NOTE — Telephone Encounter (Signed)
Informed pt he needs to come in for labs today per Dr Rhea Belton. Pt asked can he wait until tomorrow  And I informed him these labs are needed urgently to see if he is improving or regressing; informed him he may need to be hospitalized if labs are worse. When asked about his hydration yesterday, he stated he ate about 1/2 a plate of chix and rice with Sprite, but threw it up about 30 minutes later. Informed him when he comes in today , stop by our desk to pick up a Gastroparesis diet and start with Start 1 food groups; pt stated understanding.

## 2011-01-27 NOTE — Telephone Encounter (Signed)
lmom for pt to call back. We were unable to add on add'l labs today; can he come in tomorrow prior to his EGD?

## 2011-01-27 NOTE — Progress Notes (Signed)
Called lab to add labs.  

## 2011-01-27 NOTE — Telephone Encounter (Signed)
LFTs still elevated. No sign of confusion/encephalopathy or coagulopathy I'm going to perform additional testing to rule out hereditary/genetic causes of transaminitis/elevated LFTs including iron studies, hemachromatosis gene analysis, autoimmune hepatitis workup, ceruloplasmin, alpha-1 antitrypsin, antimitochondrial antibody, anti-smooth muscle antibody. I still think that alcohol is the most likely etiology and thus I will check a CDT May in fact need liver bx. He should abstain from ETOH      Discussed Dr Lauro Franklin findings/recommendations with pt. He will come in around 10am tomorrow for labs prior to his EGD.

## 2011-01-28 ENCOUNTER — Encounter: Payer: Self-pay | Admitting: Internal Medicine

## 2011-01-28 ENCOUNTER — Ambulatory Visit (AMBULATORY_SURGERY_CENTER): Payer: 59 | Admitting: Internal Medicine

## 2011-01-28 ENCOUNTER — Other Ambulatory Visit: Payer: Self-pay | Admitting: Internal Medicine

## 2011-01-28 ENCOUNTER — Telehealth: Payer: Self-pay | Admitting: Internal Medicine

## 2011-01-28 ENCOUNTER — Ambulatory Visit: Payer: 59

## 2011-01-28 DIAGNOSIS — R110 Nausea: Secondary | ICD-10-CM

## 2011-01-28 DIAGNOSIS — R109 Unspecified abdominal pain: Secondary | ICD-10-CM

## 2011-01-28 DIAGNOSIS — R1110 Vomiting, unspecified: Secondary | ICD-10-CM

## 2011-01-28 DIAGNOSIS — K2940 Chronic atrophic gastritis without bleeding: Secondary | ICD-10-CM

## 2011-01-28 DIAGNOSIS — K2970 Gastritis, unspecified, without bleeding: Secondary | ICD-10-CM

## 2011-01-28 LAB — IBC PANEL
Iron: 164 ug/dL (ref 42–165)
Saturation Ratios: 97.6 % — ABNORMAL HIGH (ref 20.0–50.0)
Transferrin: 120 mg/dL — ABNORMAL LOW (ref 212.0–360.0)

## 2011-01-28 LAB — FERRITIN: Ferritin: >1500 ng/mL — ABNORMAL HIGH (ref 22.0–322.0)

## 2011-01-28 MED ORDER — SODIUM CHLORIDE 0.9 % IV SOLN: 500.0000 mL | INTRAVENOUS | Status: DC

## 2011-01-28 MED ORDER — PANTOPRAZOLE SODIUM 40 MG PO TBEC: 40.0000 mg | DELAYED_RELEASE_TABLET | Freq: Every day | ORAL | Status: AC

## 2011-01-28 NOTE — Telephone Encounter (Signed)
Pt called back after his procedure this morning.  Said that he forgot to mention to Dr. Rhea Belton that he is having trouble working and being able to do normal daily activities.  Wanted to know if he has any suggestions as far as what could be done for him.  Explained to him that Dr Rhea Belton is off this afternoon and that I would forward a message to him regarding this.

## 2011-01-28 NOTE — Patient Instructions (Addendum)
Please refer to your blue and neon green sheets for instructions regarding diet and activity for the rest of today.  You may resume your medications as you would normally take them.   You will receive a letter in the mail in about two weeks regarding the results of the biopsies taken today.  Prescription sent for Protonix.  Gastritis Gastritis is an inflammation (the body's way of reacting to injury and/or infection) of the stomach. It is often caused by viral or bacterial (germ) infections. It can also be caused by chemicals (including alcohol) and medications. This illness may be associated with generalized malaise (feeling tired, not well), cramps, and fever. The illness may last 2 to 7 days. If symptoms of gastritis continue, gastroscopy (looking into the stomach with a telescope-like instrument), biopsy (taking tissue samples), and/or blood tests may be necessary to determine the cause. Antibiotics will not affect the illness unless there is a bacterial infection present. One common bacterial cause of gastritis is an organism known as H. Pylori. This can be treated with antibiotics. Other forms of gastritis are caused by too much acid in the stomach. They can be treated with medications such as H2 blockers and antacids. Home treatment is usually all that is needed. Young children will quickly become dehydrated (loss of body fluids) if vomiting and diarrhea are both present. Medications may be given to control nausea. Medications are usually not given for diarrhea unless especially bothersome. Some medications slow the removal of the virus from the gastrointestinal tract. This slows down the healing process. HOME CARE INSTRUCTIONS Home care instructions for nausea and vomiting:  For adults: drink small amounts of fluids often. Drink at least 2 quarts a day. Take sips frequently. Do not drink large amounts of fluid at one time. This may worsen the nausea.   Only take over-the-counter or  prescription medicines for pain, discomfort, or fever as directed by your caregiver.   Drink clear liquids only. Those are anything you can see through such as water, broth, or soft drinks.   Once you are keeping clear liquids down, you may start full liquids, soups, juices, and ice cream or sherbet. Slowly add bland (plain, not spicy) foods to your diet.  Home care instructions for diarrhea:  Diarrhea can be caused by bacterial infections or a virus. Your condition should improve with time, rest, fluids, and/or anti-diarrheal medication.   Until your diarrhea is under control, you should drink clear liquids often in small amounts. Clear liquids include: water, broth, jell-o water and weak tea.  Avoid:  Milk.   Fruits.   Tobacco.   Alcohol.   Extremely hot or cold fluids.   Too much intake of anything at one time.  When your diarrhea stops you may add the following foods, which help the stool to become more formed:  Rice.   Bananas.   Apples without skin.   Dry toast.  Once these foods are tolerated you may add low-fat yogurt and low-fat cottage cheese. They will help to restore the normal bacterial balance in your bowel. Wash your hands well to avoid spreading bacteria (germ) or virus. SEEK IMMEDIATE MEDICAL CARE IF:   You are unable to keep fluids down.   Vomiting or diarrhea become persistent (constant).   Abdominal pain develops, increases, or localizes. (Right sided pain can be appendicitis. Left sided pain in adults can be diverticulitis.)   You develop a fever (an oral temperature above 102 F (38.9 C)).   Diarrhea becomes excessive or  contains blood or mucus.   You have excessive weakness, dizziness, fainting or extreme thirst.   You are not improving or you are getting worse.   You have any other questions or concerns.  Document Released: 01/27/2001 Document Revised: 10/15/2010 Document Reviewed: 02/02/2005 [facility] Patient Information 2012 [place],  [place].  Esophagitis Esophagitis (heartburn) is a painful, burning sensation in the chest. It may feel worse in certain positions, such as lying down or bending over. It is caused by stomach acid backing up into the tube that carries food from the mouth down to the stomach (lower esophagus). TREATMENT  There are a number of non-prescription medicines used to treat heartburn, including:  Antacids.   Acid reducers (also called H-2 blockers).   Proton-pump inhibitors.  HOME CARE INSTRUCTIONS   Raise the head of your bead by putting blocks under the legs.   Eat 2-3 hours before going to bed.   Stop smoking.   Try to reach and maintain a healthy weight.   Do not eat just a few very large meals. Instead, eat many smaller meals throughout the day.   Try to identify foods and beverages that make your symptoms worse, and avoid these.   Avoid tight clothing.   Do not exercise right after eating.  SEEK IMMEDIATE MEDICAL CARE IF:  You have severe chest pain that goes down your arm, or into your jaw or neck.   You feel sweaty, dizzy, or lightheaded.   You are short of breath.   You throw up (vomit) blood.   You have difficulty or pain with swallowing.   You have bloody or black, tarry stools.   You have bouts of heartburn more than three times a week for more than two weeks.  Document Released: 03/12/2004 Document Revised: 08/18/2010 Document Reviewed: 06/13/2008 [facility] Patient Information 2012 [place], [place].

## 2011-01-28 NOTE — Op Note (Signed)
[facility] [address] [place], [place]  [address]  ENDOSCOPY PROCEDURE REPORT  PATIENT:  Evan Keller, Evan Keller  MR#:  [id number] BIRTHDATE:  02/13/1971, 40 yrs. old  GENDER:  male ENDOSCOPIST:  Carie Caddy. Allye Hoyos, MD Referred by:  Joselyn Arrow, M.D. PROCEDURE DATE:  01/28/2011 PROCEDURE:  EGD with biopsy, 43239 ASA CLASS:  Class III INDICATIONS:  nausea and vomiting, abdominal pain MEDICATIONS:   propofol (Diprivan) 500 mg IV, MAC sedation, administered by CRNA TOPICAL ANESTHETIC:  none  DESCRIPTION OF PROCEDURE:   After the risks benefits and alternatives of the procedure were thoroughly explained, informed consent was obtained.  The LB [id number] endoscope was introduced through the mouth and advanced to the second portion of the duodenum, without limitations.  The instrument was slowly withdrawn as the mucosa was fully examined. <<PROCEDUREIMAGES>>  LA Grade B esophagitis was found at the gastroesophageal junction, likely related to reflux/vomiting.  Moderate gastritis was found in the total stomach. Random biopsies were obtained and sent to pathology.  An shallow ulcer was found in the bulb of the duodenum.    Retroflexed views revealed findings as previously described.    The scope was then withdrawn from the patient and the procedure completed.  COMPLICATIONS:  None  ENDOSCOPIC IMPRESSION: 1) Esophagitis at the gastroesophageal junction 2) Moderate gastritis in the total stomach 3) Ulcer in the bulb of duodenum  RECOMMENDATIONS: 1) Await pathology results 2) avoid NSAIDS 3) Start pantoprazole 40 mg daily (30 minutes before 1st meal/snack)  Karizma Cheek M. Cheyanna Strick, MD  CC:  The Patient  n. eSIGNED:   Carie Caddy. Sender Rueb at 01/28/2011 11:28 AM  Anthony Sar, [phone]

## 2011-01-28 NOTE — Progress Notes (Signed)
Propofol was admiisitered by Brennan Bailey, CRNA. Maw  Pt tolerated the EGD very well. maw

## 2011-01-28 NOTE — Progress Notes (Signed)
Patient did not experience any of the following events: a burn prior to discharge; a fall within the facility; wrong site/side/patient/procedure/implant event; or a hospital transfer or hospital admission upon discharge from the facility. (G8907) Patient did not have preoperative order for IV antibiotic SSI prophylaxis. (G8918)  

## 2011-01-29 ENCOUNTER — Telehealth: Payer: Self-pay | Admitting: *Deleted

## 2011-01-29 LAB — ANA: Anti Nuclear Antibody(ANA): NEGATIVE

## 2011-01-29 LAB — CERULOPLASMIN: Ceruloplasmin: 159 mg/dL — ABNORMAL HIGH (ref 20–60)

## 2011-01-29 LAB — IGG: IgG (Immunoglobin G), Serum: 1650 mg/dL — ABNORMAL HIGH (ref 650–1600)

## 2011-01-29 NOTE — Telephone Encounter (Signed)
Message left to call us if necessary. 

## 2011-01-29 NOTE — Telephone Encounter (Signed)
Left msg to call us if necessary.

## 2011-01-29 NOTE — Telephone Encounter (Signed)
Not much, we need to follow his liver labs to ensure overall improvement.  We are waiting on send out studies now. Try to stay hydrated and avoid all ETOH We will be in touch when lab results are available. If he can't stay hydrated or has trouble with intractable n/v, he may need hospital

## 2011-01-30 NOTE — Telephone Encounter (Signed)
lmom for pt to call back

## 2011-01-31 LAB — HEMOCHROMATOSIS DNA-PCR(C282Y,H63D)

## 2011-02-01 ENCOUNTER — Emergency Department (HOSPITAL_COMMUNITY): Payer: Managed Care, Other (non HMO)

## 2011-02-01 ENCOUNTER — Encounter (HOSPITAL_COMMUNITY): Payer: Self-pay | Admitting: Adult Health

## 2011-02-01 ENCOUNTER — Inpatient Hospital Stay (HOSPITAL_COMMUNITY)
Admission: EM | Admit: 2011-02-01 | Discharge: 2011-02-05 | DRG: 434 | Disposition: A | Discharge status: Home / Self Care | Payer: Managed Care, Other (non HMO) | Attending: Internal Medicine | Admitting: Internal Medicine

## 2011-02-01 ENCOUNTER — Telehealth: Payer: Self-pay | Admitting: Gastroenterology

## 2011-02-01 DIAGNOSIS — R109 Unspecified abdominal pain: Secondary | ICD-10-CM | POA: Diagnosis present

## 2011-02-01 DIAGNOSIS — R7401 Elevation of levels of liver transaminase levels: Secondary | ICD-10-CM | POA: Diagnosis present

## 2011-02-01 DIAGNOSIS — G8929 Other chronic pain: Secondary | ICD-10-CM | POA: Diagnosis present

## 2011-02-01 DIAGNOSIS — K7010 Alcoholic hepatitis without ascites: Principal | ICD-10-CM | POA: Diagnosis present

## 2011-02-01 DIAGNOSIS — F329 Major depressive disorder, single episode, unspecified: Secondary | ICD-10-CM

## 2011-02-01 DIAGNOSIS — R17 Unspecified jaundice: Secondary | ICD-10-CM

## 2011-02-01 DIAGNOSIS — I10 Essential (primary) hypertension: Secondary | ICD-10-CM | POA: Diagnosis present

## 2011-02-01 DIAGNOSIS — E538 Deficiency of other specified B group vitamins: Secondary | ICD-10-CM

## 2011-02-01 DIAGNOSIS — F3289 Other specified depressive episodes: Secondary | ICD-10-CM | POA: Diagnosis present

## 2011-02-01 DIAGNOSIS — F17200 Nicotine dependence, unspecified, uncomplicated: Secondary | ICD-10-CM | POA: Diagnosis present

## 2011-02-01 DIAGNOSIS — M549 Dorsalgia, unspecified: Secondary | ICD-10-CM | POA: Diagnosis present

## 2011-02-01 DIAGNOSIS — M129 Arthropathy, unspecified: Secondary | ICD-10-CM | POA: Diagnosis present

## 2011-02-01 DIAGNOSIS — K859 Acute pancreatitis without necrosis or infection, unspecified: Secondary | ICD-10-CM

## 2011-02-01 DIAGNOSIS — K700 Alcoholic fatty liver: Secondary | ICD-10-CM | POA: Diagnosis present

## 2011-02-01 DIAGNOSIS — F1020 Alcohol dependence, uncomplicated: Secondary | ICD-10-CM | POA: Diagnosis present

## 2011-02-01 DIAGNOSIS — F32A Depression, unspecified: Secondary | ICD-10-CM | POA: Diagnosis present

## 2011-02-01 DIAGNOSIS — Z91030 Bee allergy status: Secondary | ICD-10-CM

## 2011-02-01 DIAGNOSIS — F1010 Alcohol abuse, uncomplicated: Secondary | ICD-10-CM | POA: Diagnosis present

## 2011-02-01 DIAGNOSIS — D7589 Other specified diseases of blood and blood-forming organs: Secondary | ICD-10-CM | POA: Diagnosis present

## 2011-02-01 DIAGNOSIS — R7989 Other specified abnormal findings of blood chemistry: Secondary | ICD-10-CM | POA: Diagnosis present

## 2011-02-01 DIAGNOSIS — R7402 Elevation of levels of lactic acid dehydrogenase (LDH): Secondary | ICD-10-CM | POA: Diagnosis present

## 2011-02-01 DIAGNOSIS — R112 Nausea with vomiting, unspecified: Secondary | ICD-10-CM | POA: Diagnosis present

## 2011-02-01 DIAGNOSIS — Z720 Tobacco use: Secondary | ICD-10-CM | POA: Diagnosis present

## 2011-02-01 LAB — CBC
HCT: 30.9 % — ABNORMAL LOW (ref 39.0–52.0)
MCHC: 36.6 g/dL — ABNORMAL HIGH (ref 30.0–36.0)
MCV: 90.9 fL (ref 78.0–100.0)
Platelets: 133 10*3/uL — ABNORMAL LOW (ref 150–400)
RDW: 18.9 % — ABNORMAL HIGH (ref 11.5–15.5)

## 2011-02-01 LAB — COMPREHENSIVE METABOLIC PANEL
AST: 92 U/L — ABNORMAL HIGH (ref 0–37)
Albumin: 2.5 g/dL — ABNORMAL LOW (ref 3.5–5.2)
Calcium: 8.7 mg/dL (ref 8.4–10.5)
Creatinine, Ser: 1.03 mg/dL (ref 0.50–1.35)
Sodium: 134 mEq/L — ABNORMAL LOW (ref 135–145)
Total Protein: 5.9 g/dL — ABNORMAL LOW (ref 6.0–8.3)

## 2011-02-01 LAB — PROTIME-INR
INR: 0.97 (ref 0.00–1.49)
Prothrombin Time: 13.1 seconds (ref 11.6–15.2)

## 2011-02-01 LAB — ACETAMINOPHEN LEVEL: Acetaminophen (Tylenol), Serum: <15 ug/mL (ref 10–30)

## 2011-02-01 LAB — URINALYSIS, ROUTINE W REFLEX MICROSCOPIC
Glucose, UA: NEGATIVE mg/dL
Leukocytes, UA: NEGATIVE
Protein, ur: NEGATIVE mg/dL
Specific Gravity, Urine: 1.019 (ref 1.005–1.030)
pH: 8 (ref 5.0–8.0)

## 2011-02-01 LAB — DIFFERENTIAL
Basophils Absolute: 0 10*3/uL (ref 0.0–0.1)
Basophils Relative: 1 % (ref 0–1)
Eosinophils Relative: 1 % (ref 0–5)
Monocytes Absolute: 0.5 10*3/uL (ref 0.1–1.0)

## 2011-02-01 LAB — APTT: aPTT: 32 seconds (ref 24–37)

## 2011-02-01 MED ORDER — ONDANSETRON HCL 4 MG/2ML IJ SOLN
4.0000 mg | Freq: Once | INTRAMUSCULAR | Status: AC
  Administered 2011-02-01: 4 mg via INTRAVENOUS
  Filled 2011-02-01: qty 2

## 2011-02-01 MED ORDER — ONDANSETRON HCL 4 MG PO TABS: 4.0000 mg | ORAL_TABLET | Freq: Four times a day (QID) | ORAL | Status: DC | PRN

## 2011-02-01 MED ORDER — LORAZEPAM 2 MG/ML IJ SOLN: 1.0000 mg | Freq: Four times a day (QID) | INTRAMUSCULAR | Status: AC | PRN

## 2011-02-01 MED ORDER — PANTOPRAZOLE SODIUM 40 MG IV SOLR
40.0000 mg | Freq: Every day | INTRAVENOUS | Status: DC
  Administered 2011-02-01 – 2011-02-03 (×3): 40 mg via INTRAVENOUS
  Filled 2011-02-01 (×3): qty 40

## 2011-02-01 MED ORDER — HYDROMORPHONE HCL PF 1 MG/ML IJ SOLN
1.0000 mg | Freq: Once | INTRAMUSCULAR | Status: AC
  Administered 2011-02-01: 1 mg via INTRAVENOUS
  Filled 2011-02-01: qty 1

## 2011-02-01 MED ORDER — SERTRALINE HCL 50 MG PO TABS
50.0000 mg | ORAL_TABLET | Freq: Every day | ORAL | Status: DC
  Administered 2011-02-01 – 2011-02-05 (×5): 50 mg via ORAL
  Filled 2011-02-01 (×5): qty 1

## 2011-02-01 MED ORDER — MORPHINE SULFATE 2 MG/ML IJ SOLN
2.0000 mg | INTRAMUSCULAR | Status: DC | PRN
  Administered 2011-02-01 – 2011-02-02 (×6): 2 mg via INTRAVENOUS
  Filled 2011-02-01 (×6): qty 1

## 2011-02-01 MED ORDER — ACETAMINOPHEN 650 MG RE SUPP: 650.0000 mg | Freq: Four times a day (QID) | RECTAL | Status: DC | PRN

## 2011-02-01 MED ORDER — SODIUM CHLORIDE 0.9 % IV BOLUS (SEPSIS)
1000.0000 mL | Freq: Once | INTRAVENOUS | Status: AC
  Administered 2011-02-01: 1000 mL via INTRAVENOUS

## 2011-02-01 MED ORDER — LORAZEPAM 1 MG PO TABS
1.0000 mg | ORAL_TABLET | Freq: Four times a day (QID) | ORAL | Status: AC | PRN
  Filled 2011-02-01: qty 1

## 2011-02-01 MED ORDER — DEXTROSE-NACL 5-0.9 % IV SOLN
INTRAVENOUS | Status: DC
  Administered 2011-02-01 – 2011-02-03 (×4): via INTRAVENOUS

## 2011-02-01 MED ORDER — ALUM & MAG HYDROXIDE-SIMETH 200-200-20 MG/5ML PO SUSP: 30.0000 mL | Freq: Four times a day (QID) | ORAL | Status: DC | PRN

## 2011-02-01 MED ORDER — BIOTENE DRY MOUTH MT LIQD
15.0000 mL | Freq: Two times a day (BID) | OROMUCOSAL | Status: DC
  Administered 2011-02-02 – 2011-02-05 (×7): 15 mL via OROMUCOSAL

## 2011-02-01 MED ORDER — ONDANSETRON HCL 4 MG/2ML IJ SOLN: 4.0000 mg | Freq: Four times a day (QID) | INTRAMUSCULAR | Status: DC | PRN

## 2011-02-01 MED ORDER — THERA M PLUS PO TABS
1.0000 | ORAL_TABLET | Freq: Every day | ORAL | Status: DC
  Administered 2011-02-01 – 2011-02-05 (×5): 1 via ORAL
  Filled 2011-02-01 (×5): qty 1

## 2011-02-01 MED ORDER — NICOTINE 21 MG/24HR TD PT24
21.0000 mg | MEDICATED_PATCH | Freq: Every day | TRANSDERMAL | Status: DC
  Administered 2011-02-01 – 2011-02-05 (×5): 21 mg via TRANSDERMAL
  Filled 2011-02-01 (×5): qty 1

## 2011-02-01 MED ORDER — VITAMIN B-1 100 MG PO TABS
100.0000 mg | ORAL_TABLET | Freq: Every day | ORAL | Status: DC
  Administered 2011-02-01 – 2011-02-05 (×5): 100 mg via ORAL
  Filled 2011-02-01 (×5): qty 1

## 2011-02-01 MED ORDER — FOLIC ACID 1 MG PO TABS
1.0000 mg | ORAL_TABLET | Freq: Every day | ORAL | Status: DC
  Administered 2011-02-01 – 2011-02-05 (×5): 1 mg via ORAL
  Filled 2011-02-01 (×5): qty 1

## 2011-02-01 MED ORDER — POLYETHYLENE GLYCOL 3350 17 G PO PACK
17.0000 g | PACK | Freq: Every day | ORAL | Status: DC | PRN
  Filled 2011-02-01: qty 1

## 2011-02-01 MED ORDER — ACETAMINOPHEN 325 MG PO TABS: 325.0000 mg | ORAL_TABLET | Freq: Four times a day (QID) | ORAL | Status: DC | PRN

## 2011-02-01 MED ORDER — LORAZEPAM 2 MG/ML IJ SOLN
0.0000 mg | Freq: Four times a day (QID) | INTRAMUSCULAR | Status: AC
  Administered 2011-02-02: 1 mg via INTRAVENOUS
  Filled 2011-02-01: qty 1

## 2011-02-01 MED ORDER — LORAZEPAM 2 MG/ML IJ SOLN: 0.0000 mg | Freq: Two times a day (BID) | INTRAMUSCULAR | Status: DC

## 2011-02-01 MED ORDER — CHLORHEXIDINE GLUCONATE 0.12 % MT SOLN
15.0000 mL | Freq: Two times a day (BID) | OROMUCOSAL | Status: DC
  Administered 2011-02-02 – 2011-02-05 (×6): 15 mL via OROMUCOSAL
  Filled 2011-02-01 (×9): qty 15

## 2011-02-01 NOTE — ED Notes (Signed)
C/o abdominal pain for months that has progressively worsened. Pt is jaundiced has a hx of pancreatitis and inflammation of the liver. C/o emeisis and 10/10 abdominal pain.
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
## 2011-02-01 NOTE — Telephone Encounter (Signed)
[name] called stating that he's had persistent nausea and vomiting and is unable to keep anything down. This has een an ongoing problem for which  he has been seen by various physicians. He has upper abdominal pain as well.  I instructed the patient to go to the emergency room for evaluation
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
## 2011-02-01 NOTE — ED Notes (Signed)
Patient in restroom- ambulated, no assistance needed.

## 2011-02-01 NOTE — ED Notes (Signed)
Pt lying in bed with IV running TKO. NAD. VSS

## 2011-02-02 ENCOUNTER — Inpatient Hospital Stay (HOSPITAL_COMMUNITY): Payer: Managed Care, Other (non HMO)

## 2011-02-02 DIAGNOSIS — R945 Abnormal results of liver function studies: Secondary | ICD-10-CM

## 2011-02-02 DIAGNOSIS — K7010 Alcoholic hepatitis without ascites: Secondary | ICD-10-CM

## 2011-02-02 LAB — CBC
HCT: 27.3 % — ABNORMAL LOW (ref 39.0–52.0)
Hemoglobin: 9.7 g/dL — ABNORMAL LOW (ref 13.0–17.0)
MCH: 33 pg (ref 26.0–34.0)
MCHC: 35.5 g/dL (ref 30.0–36.0)

## 2011-02-02 LAB — COMPREHENSIVE METABOLIC PANEL
ALT: 57 U/L — ABNORMAL HIGH (ref 0–53)
AST: 92 U/L — ABNORMAL HIGH (ref 0–37)
CO2: 28 mEq/L (ref 19–32)
Calcium: 8.3 mg/dL — ABNORMAL LOW (ref 8.4–10.5)
GFR calc non Af Amer: 82 mL/min — ABNORMAL LOW (ref >90)
Sodium: 135 mEq/L (ref 135–145)
Total Protein: 5.3 g/dL — ABNORMAL LOW (ref 6.0–8.3)

## 2011-02-02 MED ORDER — HYDROMORPHONE HCL PF 2 MG/ML IJ SOLN
2.0000 mg | INTRAMUSCULAR | Status: DC | PRN
  Administered 2011-02-02 – 2011-02-05 (×14): 2 mg via INTRAVENOUS
  Filled 2011-02-02 (×14): qty 1

## 2011-02-02 NOTE — Telephone Encounter (Signed)
Pt was hospitalized over the weekend.
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
## 2011-02-02 NOTE — H&P (Signed)
Agree with above 
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
## 2011-02-02 NOTE — Progress Notes (Signed)
Subjective: Pt mentions that he felt well enough to eat today.  Requested food and ate most of it.  Denies any current nausea or bouts of emesis today.  Some abdominal discomfort.  Denies any BRBPR, fever, chills, cough.  Objective: Filed Vitals:   02/01/11 2110 02/02/11 0215 02/02/11 0500 02/02/11 1400  BP: 138/84 118/69 103/66 123/85  Pulse: 85 67 64 88  Temp: 97.9 F (36.6 C) 97.9 F (36.6 C) 97.9 F (36.6 C) 98.6 F (37 C)  TempSrc: Oral Oral Oral Oral  Resp:  16 16 18   Height: 6\' 3"  (1.905 m)     Weight: 99.791 kg (220 lb)     SpO2: 97% 99% 98% 97%   Weight change:   Intake/Output Summary (Last 24 hours) at 02/02/11 2029 Last data filed at 02/02/11 1800  Gross per 24 hour  Intake    653 ml  Output   1500 ml  Net   -847 ml    General: Alert, awake, oriented x3, in no acute distress.  HEENT: No bruits, no goiter.  Heart: Regular rate and rhythm, without murmurs, rubs, gallops.  Lungs: Crackles left side, bilateral air movement.  Abdomen: No guarding, RUQ discomfort with palpation Neuro: Grossly intact, nonfocal.   Lab Results:  [facility] 02/02/11 0415 02/01/11 1633  NA 135 134*  K 3.6 3.9  CL 97 94*  CO2 28 33*  GLUCOSE 94 133*  BUN 13 12  CREATININE 1.12 1.03  CALCIUM 8.3* 8.7  MG -- --  PHOS -- --    Basename 02/02/11 0415 02/01/11 1633  AST 92* 92*  ALT 57* 64*  ALKPHOS 340* 397*  BILITOT 6.2* 6.2*  PROT 5.3* 5.9*  ALBUMIN 2.1* 2.5*    Basename 02/01/11 1633  LIPASE 43  AMYLASE --    Basename 02/02/11 0415 02/01/11 1633  WBC 4.0 4.8  NEUTROABS -- 2.9  HGB 9.7* 11.3*  HCT 27.3* 30.9*  MCV 92.9 90.9  PLT 117* 133*   No results found for this basename: CKTOTAL:3,CKMB:3,CKMBINDEX:3,TROPONINI:3 in the last 72 hours No components found with this basename: POCBNP:3 No results found for this basename: DDIMER:2 in the last 72 hours No results found for this basename: HGBA1C:2 in the last 72 hours No results found for this basename:  CHOL:2,HDL:2,LDLCALC:2,TRIG:2,CHOLHDL:2,LDLDIRECT:2 in the last 72 hours No results found for this basename: TSH,T4TOTAL,FREET3,T3FREE,THYROIDAB in the last 72 hours No results found for this basename: VITAMINB12:2,FOLATE:2,FERRITIN:2,TIBC:2,IRON:2,RETICCTPCT:2 in the last 72 hours  Micro Results: No results found for this or any previous visit (from the past 240 hour(s)).  Studies/Results: US Abdomen Complete  02/01/2011  *RADIOLOGY REPORT*  Clinical Data:  Elevated liver function studies.  COMPLETE ABDOMINAL ULTRASOUND  Comparison:  CT scan 12/16/2010.  Findings:  Gallbladder:  Surgically absent.  Common bile duct:  Normal in caliber status post cholecystectomymeasuring a maximum of 6.34mm.  Liver:  There is diffuse increased echogenicity of the liver and decreased through transmission consistent with fatty infiltration. No focal lesions or biliary dilatation.  IVC:  Normal caliber.  Pancreas:  Sonographically normal.  Spleen:  Normal size and echogenicity without focal lesions.  Right Kidney:  13.2 cm in length. Normal renal cortical thickness and echogenicity without focal lesions or hydronephrosis.  Left Kidney:  11.7 cm in length. Normal renal cortical thickness and echogenicity without focal lesions or hydronephrosis.  Abdominal aorta:  Normal caliber.  IMPRESSION:  1.  Status post cholecystectomy with normal caliber common bile duct. 2.  Diffuse fatty infiltration of the liver.  Original  Report Authenticated By: P. Loralie Champagne, M.D.    Medications: I have reviewed the patient's current medications.  - Nausea/vomiting:  Zofran on board.  Resolving as patient was able to eat and keep po intake down.  Patient is to get liver biopsy tomorrow.  Would appreciate GI's input regarding disposition from their standpoint regarding this patient.  .Depression: Continue Zoloft. Patient stable currently  .Essential hypertension, benign: Currently stable off of medication  .Back pain, chronic: No  complaints today   .Macrocytosis without anemia: Secondary to his drinking. Have provided thiamine and folate and multivitamin replacement.   .Nausea & vomiting: Likely this is from alcoholic hepatitis and/or perhaps on chronic pancreatitis. We'll make n.p.o. and treated pain and nausea medications. Slowly advance his diet.   .Chronic alcoholic hepatitis: Will d/c tylenol as prn medication  H/o chronic alcohol use:  Pt is on alcohol withdrawal pathway.  .Fatty liver, alcoholic: Gi on board   Tobacco abuse: Nicotine patch     LOS: 1 day   Penny Pia M.D.  [facility] 02/02/2011, 8:29 PM

## 2011-02-03 ENCOUNTER — Inpatient Hospital Stay (HOSPITAL_COMMUNITY): Payer: Managed Care, Other (non HMO)

## 2011-02-03 ENCOUNTER — Encounter: Payer: Self-pay | Admitting: Internal Medicine

## 2011-02-03 DIAGNOSIS — K759 Inflammatory liver disease, unspecified: Secondary | ICD-10-CM

## 2011-02-03 LAB — HEPATIC FUNCTION PANEL
Alkaline Phosphatase: 340 U/L — ABNORMAL HIGH (ref 39–117)
Indirect Bilirubin: 2.6 mg/dL — ABNORMAL HIGH (ref 0.3–0.9)
Total Bilirubin: 6.9 mg/dL — ABNORMAL HIGH (ref 0.3–1.2)

## 2011-02-03 MED ORDER — MIDAZOLAM HCL 5 MG/5ML IJ SOLN
INTRAMUSCULAR | Status: AC | PRN
  Administered 2011-02-03 (×2): 2 mg via INTRAVENOUS

## 2011-02-03 MED ORDER — PANTOPRAZOLE SODIUM 40 MG PO TBEC
40.0000 mg | DELAYED_RELEASE_TABLET | Freq: Every day | ORAL | Status: DC
  Administered 2011-02-04 – 2011-02-05 (×2): 40 mg via ORAL
  Filled 2011-02-03 (×2): qty 1

## 2011-02-03 MED ORDER — FENTANYL CITRATE 0.05 MG/ML IJ SOLN
INTRAMUSCULAR | Status: AC | PRN
  Administered 2011-02-03 (×2): 100 ug via INTRAVENOUS

## 2011-02-03 NOTE — Procedures (Signed)
Successful [place] RT liver random 18g core bx No comp Stable Path pending

## 2011-02-03 NOTE — Progress Notes (Signed)
Subjective: Pt mentions that he has been feeling better.  States that the dilaudid has helped him with the discomfort.  Is inquiring about when he will be discharged.  States that he is worried about missing too much time at this new job. But understands that he needs to recuperate before being able to go back to work.  Has managed to eat yesterday and today.  Is thinking about ordering another meal. Objective: Filed Vitals:   02/03/11 1220 02/03/11 1235 02/03/11 1309 02/03/11 1400  BP: 144/85 113/83 133/83 130/82  Pulse:  82 82 71  Temp: 98 F (36.7 C) 98.1 F (36.7 C) 98 F (36.7 C) 98.2 F (36.8 C)  TempSrc: Oral Oral Oral Oral  Resp: 18 18 18 20   Height:      Weight:      SpO2:   98% 94%   Weight change:   Intake/Output Summary (Last 24 hours) at 02/03/11 1808 Last data filed at 02/03/11 1300  Gross per 24 hour  Intake    840 ml  Output   1450 ml  Net   -610 ml    General: Alert, awake, oriented x3, in no acute distress.  HEENT: No bruits, no goiter.  Heart: Regular rate and rhythm, without murmurs, rubs, gallops.  Lungs: Crackles left side, bilateral air movement.  Abdomen: No guarding, nd, positive bowel sounds.  Neuro: Grossly intact, nonfocal.   Lab Results:  [facility] 02/02/11 0415 02/01/11 1633  NA 135 134*  K 3.6 3.9  CL 97 94*  CO2 28 33*  GLUCOSE 94 133*  BUN 13 12  CREATININE 1.12 1.03  CALCIUM 8.3* 8.7  MG -- --  PHOS -- --    Basename 02/03/11 0400 02/02/11 0415  AST 90* 92*  ALT 59* 57*  ALKPHOS 340* 340*  BILITOT 6.9* 6.2*  PROT 6.0 5.3*  ALBUMIN 2.6* 2.1*    Basename 02/01/11 1633  LIPASE 43  AMYLASE --    Basename 02/02/11 0415 02/01/11 1633  WBC 4.0 4.8  NEUTROABS -- 2.9  HGB 9.7* 11.3*  HCT 27.3* 30.9*  MCV 92.9 90.9  PLT 117* 133*   No results found for this basename: CKTOTAL:3,CKMB:3,CKMBINDEX:3,TROPONINI:3 in the last 72 hours No components found with this basename: POCBNP:3 No results found for this basename: DDIMER:2  in the last 72 hours No results found for this basename: HGBA1C:2 in the last 72 hours No results found for this basename: CHOL:2,HDL:2,LDLCALC:2,TRIG:2,CHOLHDL:2,LDLDIRECT:2 in the last 72 hours No results found for this basename: TSH,T4TOTAL,FREET3,T3FREE,THYROIDAB in the last 72 hours No results found for this basename: VITAMINB12:2,FOLATE:2,FERRITIN:2,TIBC:2,IRON:2,RETICCTPCT:2 in the last 72 hours  Micro Results: No results found for this or any previous visit (from the past 240 hour(s)).  Studies/Results: US Abdomen Complete  02/01/2011  *RADIOLOGY REPORT*  Clinical Data:  Elevated liver function studies.  COMPLETE ABDOMINAL ULTRASOUND  Comparison:  CT scan 12/16/2010.  Findings:  Gallbladder:  Surgically absent.  Common bile duct:  Normal in caliber status post cholecystectomymeasuring a maximum of 6.79mm.  Liver:  There is diffuse increased echogenicity of the liver and decreased through transmission consistent with fatty infiltration. No focal lesions or biliary dilatation.  IVC:  Normal caliber.  Pancreas:  Sonographically normal.  Spleen:  Normal size and echogenicity without focal lesions.  Right Kidney:  13.2 cm in length. Normal renal cortical thickness and echogenicity without focal lesions or hydronephrosis.  Left Kidney:  11.7 cm in length. Normal renal cortical thickness and echogenicity without focal lesions or hydronephrosis.  Abdominal  aorta:  Normal caliber.  IMPRESSION:  1.  Status post cholecystectomy with normal caliber common bile duct. 2.  Diffuse fatty infiltration of the liver.  Original Report Authenticated By: P. Loralie Champagne, M.D.   US Biopsy  02/03/2011  *RADIOLOGY REPORT*  Clinical: Alcoholic hepatitis, elevated bilirubin, abnormal LFTs  ULTRASOUND GUIDED CORE LIVER BIOPSY  Sedation:  4 mg IV Versed; 200 mcg IV Fentanyl  Total Moderate Sedation Time:  15 minutes minutes.  An ultrasound guided liver biopsy was thoroughly discussed with the patient and questions were  answered.  The benefits, risks, alternatives, and complications were also discussed.  The patient understands and wishes to proceed with the procedure.  Written consent was obtained.  Ultrasound of the liver was performed and an appropriate skin entry site was determined.  Skin site was marked, prepped with Betadine, and draped in the usual sterile fashion.  Local anesthesia was provided with 1% Lidocaine.  A 17 gauge trocar needle was advanced under ultrasound guidance into the liver.  A total of 2 coaxial 18 gauge core samples were then obtained through the guide needle. The guide needle was removed. Post procedure scans were obtained.  Complications:  No immediate  Findings:  Ultrasound imaging confirms needle placed in the right hepatic lobe.  2  18 gauge core biopsies obtained.  IMPRESSION: Successful ultrasound guided random core biopsy of the liver.  Original Report Authenticated By: Judie Petit. Ruel Favors, M.D.    Medications: I have reviewed the patient's current medications.   - Nausea/vomiting: Zofran on board. Resolving as patient was able to eat and keep po intake down.   Would appreciate GI's input regarding disposition from their standpoint regarding this patient.   .Depression: Continue Zoloft. Patient stable currently   .Essential hypertension, benign: Currently stable off of medication although today had an elevated reading of 152/86  .Back pain, chronic: No complaints today   .Macrocytosis without anemia: Secondary to his drinking. Have provided thiamine and folate and multivitamin replacement.   .Nausea & vomiting: Likely this is from alcoholic hepatitis and/or perhaps on chronic pancreatitis. We'll make n.p.o. and treated pain and nausea medications. Slowly advance his diet.   .Chronic alcoholic hepatitis: Will d/c tylenol as prn medication  H/o chronic alcohol use: Pt is on alcohol withdrawal pathway.   .Fatty liver, alcoholic: Gi on board  Tobacco abuse: Nicotine patch        LOS: 2 days   Penny Pia M.D.  [facility] 02/03/2011, 6:08 PM

## 2011-02-03 NOTE — Progress Notes (Signed)
The patient is receiving Protonix by the intravenous route. Based on criteria approved by the Pharmacy and Therapeutics Committee and the Medical Executive Committee, the medication is being converted to the equivalent oral dose form.   These criteria include:  -No Active GI bleeding  -Able to tolerate diet of full liquids (or better) or tube feeding  -Able to tolerate other medications by the oral or enteral route   If you have any questions about this conversion, please contact the Pharmacy Department (ext [phone]). Thank you.   Rollene Fare, 02/03/2011, 12:56 PM

## 2011-02-03 NOTE — ED Notes (Signed)
Timeout completed at 1104

## 2011-02-03 NOTE — Progress Notes (Signed)
Subjective: Having liver biopsy today with IR.  He ate a normal, low sodium diet last night and tomato soup.  He had no nausea.  He said morphine wasn't working for abd pains, had it switched to dilaudid.   Objective: Vital signs in last 24 hours: Temp:  [98.3 F (36.8 C)-98.8 F (37.1 C)] 98.4 F (36.9 C) (12/18 0600) Pulse Rate:  [75-88] 75  (12/18 0600) Resp:  [18] 18  (12/18 0600) BP: (119-127)/(74-88) 124/74 mmHg (12/18 0600) SpO2:  [97 %-99 %] 99 % (12/18 0600) Last BM Date: 01/31/11 General: alert and oriented times 3 Heart: regular rate and rythm Abdomen: soft, non-tender, non-distended, normal bowel sounds   Lab Results:  [facility] 02/02/11 0415 02/01/11 1633  WBC 4.0 4.8  HGB 9.7* 11.3*  PLT 117* 133*  MCV 92.9 90.9   BMET   Basename 02/02/11 0415 02/01/11 1633  NA 135 134*  K 3.6 3.9  CL 97 94*  CO2 28 33*  GLUCOSE 94 133*  BUN 13 12  CREATININE 1.12 1.03  CALCIUM 8.3* 8.7   LFT  Basename 02/03/11 0400 02/02/11 0415 02/01/11 1633  PROT 6.0 5.3* 5.9*  ALBUMIN 2.6* 2.1* 2.5*  AST 90* 92* 92*  ALT 59* 57* 64*  ALKPHOS 340* 340* 397*  BILITOT 6.9* 6.2* 6.2*  BILIDIR 4.3* -- --  IBILI 2.6* -- --   PT/INR  Basename 02/01/11 1633  INR 0.97    Imaging/Other results: US Abdomen Complete  02/01/2011  *RADIOLOGY REPORT*  Clinical Data:  Elevated liver function studies.  COMPLETE ABDOMINAL ULTRASOUND  Comparison:  CT scan 12/16/2010.  Findings:  Gallbladder:  Surgically absent.  Common bile duct:  Normal in caliber status post cholecystectomymeasuring a maximum of 6.63mm.  Liver:  There is diffuse increased echogenicity of the liver and decreased through transmission consistent with fatty infiltration. No focal lesions or biliary dilatation.  IVC:  Normal caliber.  Pancreas:  Sonographically normal.  Spleen:  Normal size and echogenicity without focal lesions.  Right Kidney:  13.2 cm in length. Normal renal cortical thickness and echogenicity without focal  lesions or hydronephrosis.  Left Kidney:  11.7 cm in length. Normal renal cortical thickness and echogenicity without focal lesions or hydronephrosis.  Abdominal aorta:  Normal caliber.  IMPRESSION:  1.  Status post cholecystectomy with normal caliber common bile duct. 2.  Diffuse fatty infiltration of the liver.  Original Report Authenticated By: P. Loralie Champagne, M.D.      Assessment/Plan: 40 y.o. male  With hepatitis, likely acute etoh, also drug seeking behavior  For liver biopsy today.  He was very focused on his abd pains today, I explained very clearly that we may not be able to explain the pains (given mulitple tests he's had so far).  Overall he seems like he is drug seeking but we are proceeding with liver biopsy given his very elevated liver tests.    Should HL IV after the liver biopsy.    Rob Bunting, MD  02/03/2011, 7:16 AM [facility] Pager [phone]

## 2011-02-04 DIAGNOSIS — K759 Inflammatory liver disease, unspecified: Secondary | ICD-10-CM

## 2011-02-04 DIAGNOSIS — R945 Abnormal results of liver function studies: Secondary | ICD-10-CM

## 2011-02-04 DIAGNOSIS — K7010 Alcoholic hepatitis without ascites: Secondary | ICD-10-CM

## 2011-02-04 MED ORDER — MORPHINE SULFATE CR 15 MG PO TB12
15.0000 mg | ORAL_TABLET | Freq: Two times a day (BID) | ORAL | Status: DC
  Administered 2011-02-04: 15 mg via ORAL
  Filled 2011-02-04: qty 1

## 2011-02-04 NOTE — Progress Notes (Signed)
Subjective:  Complains of continued abdominal pain even though he tolerates his diet.   Objective: Filed Vitals:   02/03/11 1800 02/03/11 2125 02/04/11 0641 02/04/11 1400  BP: 126/85 114/70 128/70 145/63  Pulse: 87 76 70 102  Temp: 97.5 F (36.4 C) 98.3 F (36.8 C) 97.8 F (36.6 C) 98.6 F (37 C)  TempSrc: Oral Oral Oral Oral  Resp: 20 18 18 18   Height:      Weight:      SpO2: 96% 98% 99% 97%   Weight change:   Intake/Output Summary (Last 24 hours) at 02/04/11 1910 Last data filed at 02/04/11 1400  Gross per 24 hour  Intake   1520 ml  Output   2400 ml  Net   -880 ml    General: Alert, awake, oriented x3, in no acute distress.  HEENT: No bruits, no goiter.  Heart: Regular rate and rhythm, without murmurs, rubs, gallops.  Lungs: Crackles left side, bilateral air movement.  Abdomen: No guarding, nd, positive bowel sounds.  Neuro: Grossly intact, nonfocal.   Lab Results:  [facility] 02/02/11 0415  NA 135  K 3.6  CL 97  CO2 28  GLUCOSE 94  BUN 13  CREATININE 1.12  CALCIUM 8.3*  MG --  PHOS --    Basename 02/03/11 0400 02/02/11 0415  AST 90* 92*  ALT 59* 57*  ALKPHOS 340* 340*  BILITOT 6.9* 6.2*  PROT 6.0 5.3*  ALBUMIN 2.6* 2.1*   No results found for this basename: LIPASE:2,AMYLASE:2 in the last 72 hours  Basename 02/02/11 0415  WBC 4.0  NEUTROABS --  HGB 9.7*  HCT 27.3*  MCV 92.9  PLT 117*   No results found for this basename: CKTOTAL:3,CKMB:3,CKMBINDEX:3,TROPONINI:3 in the last 72 hours No components found with this basename: POCBNP:3 No results found for this basename: DDIMER:2 in the last 72 hours No results found for this basename: HGBA1C:2 in the last 72 hours No results found for this basename: CHOL:2,HDL:2,LDLCALC:2,TRIG:2,CHOLHDL:2,LDLDIRECT:2 in the last 72 hours No results found for this basename: TSH,T4TOTAL,FREET3,T3FREE,THYROIDAB in the last 72 hours No results found for this basename:  VITAMINB12:2,FOLATE:2,FERRITIN:2,TIBC:2,IRON:2,RETICCTPCT:2 in the last 72 hours  Micro Results: No results found for this or any previous visit (from the past 240 hour(s)).  Studies/Results: US Biopsy  02/03/2011  *RADIOLOGY REPORT*  Clinical: Alcoholic hepatitis, elevated bilirubin, abnormal LFTs  ULTRASOUND GUIDED CORE LIVER BIOPSY  Sedation:  4 mg IV Versed; 200 mcg IV Fentanyl  Total Moderate Sedation Time:  15 minutes minutes.  An ultrasound guided liver biopsy was thoroughly discussed with the patient and questions were answered.  The benefits, risks, alternatives, and complications were also discussed.  The patient understands and wishes to proceed with the procedure.  Written consent was obtained.  Ultrasound of the liver was performed and an appropriate skin entry site was determined.  Skin site was marked, prepped with Betadine, and draped in the usual sterile fashion.  Local anesthesia was provided with 1% Lidocaine.  A 17 gauge trocar needle was advanced under ultrasound guidance into the liver.  A total of 2 coaxial 18 gauge core samples were then obtained through the guide needle. The guide needle was removed. Post procedure scans were obtained.  Complications:  No immediate  Findings:  Ultrasound imaging confirms needle placed in the right hepatic lobe.  2  18 gauge core biopsies obtained.  IMPRESSION: Successful ultrasound guided random core biopsy of the liver.  Original Report Authenticated By: Judie Petit. Ruel Favors, M.D.    Medications: I  have reviewed the patient's current medications.   - Nausea/vomiting: Zofran on board. Resolving as patient was able to eat and keep po intake down.   Per GI okay for discharge. Will plan for DC in the morning.   .Depression: Continue Zoloft. Patient stable currently   .Essential hypertension, benign: Currently stable off of medication.   .Back pain, chronic: No complaints today   .Macrocytosis without anemia: thiamine and folate and  multivitamin replacement.   .Chronic alcoholic hepatitis:  Pathology results pending. Patient will followup outpatient with GI.   Marland KitchenFatty liver, alcoholic: Gi on board patient told no alcohol.  Tobacco abuse: Nicotine patch       LOS: 3 days   Earlene Plater MD, Ladell Pier M.D.  [facility] 02/04/2011, 7:10 PM

## 2011-02-04 NOTE — Progress Notes (Addendum)
[facility] Progress Note  SUBJECTIVE: Still with nausea and constant upper abdominal pain  OBJECTIVE:  Vital signs in last 24 hours: Temp:  [97.5 F (36.4 C)-98.3 F (36.8 C)] 97.8 F (36.6 C) (12/19 0641) Pulse Rate:  [70-87] 70  (12/19 0641) Resp:  [16-20] 18  (12/19 0641) BP: (111-152)/(70-92) 128/70 mmHg (12/19 0641) SpO2:  [94 %-100 %] 99 % (12/19 0641) Last BM Date: 01/31/11 General:   Well-developed, white male in NAD Heart:  Regular rate and rhythm Abdomen:  Soft, nontender and nondistended. Normal bowel sounds. Extremities:  Without edema. Neurologic:  Alert and  oriented x4;  grossly normal neurologically. Psych:  Cooperative. Normal mood and affect.    Lab Results:  [facility] 02/02/11 0415 02/01/11 1633  WBC 4.0 4.8  HGB 9.7* 11.3*  HCT 27.3* 30.9*  PLT 117* 133*   BMET  Basename 02/02/11 0415 02/01/11 1633  NA 135 134*  K 3.6 3.9  CL 97 94*  CO2 28 33*  GLUCOSE 94 133*  BUN 13 12  CREATININE 1.12 1.03  CALCIUM 8.3* 8.7      ASSESSMENT / PLAN: Abnormal LFTs, upper abdominal pain and nausea. Workup negative so far. Liver biospy yesterday, awaiting path. ETOH hepatitis is high on differential list. Patient looks good, abdominal exam is benign and he is eating solids.He continues to use Dilaudid around the clock.     LOS: 3 days   Willette Cluster  02/04/2011, 9:28 AM    ________________________________________________________________________  [facility] MD note:  I personally examined the patient, reviewed the data and agree with the assessment and plan described above.  I cannot explain his pains.  He is requiring IV narcotic pain meds however.  He is eating well, no nausea, no vomiting.  I recommend not prescribing further pain meds for this.   [facility] will not be prescribing narcotics as an outpatient.  His liver biopsy may take several days to get back.  He does not need to stay in the hosp until then. I explained to him very  clearly that he needs to 100% completely avoid alcohol.  I explained that etoh is at least a major contributor to his liver disease and may be the entire story.  It could take days to weeks for his liver tests to return to normal and if he indeed has underlying cirrhosis the liver tests may not ever normalize.  Please call or page with any further questions or concerns.    Rob Bunting, MD [facility] Pager [phone]    We will set up LFTs to be checked early next week at [facility] office.

## 2011-02-05 LAB — HEPATIC FUNCTION PANEL
Albumin: 2.5 g/dL — ABNORMAL LOW (ref 3.5–5.2)
Alkaline Phosphatase: 303 U/L — ABNORMAL HIGH (ref 39–117)
Total Protein: 6 g/dL (ref 6.0–8.3)

## 2011-02-05 NOTE — Progress Notes (Signed)
Patient has received pain medicine x 2 this shift for c/o 6/10 mid upper abd deep pain.

## 2011-02-05 NOTE — Discharge Summary (Signed)
Physician Discharge Summary  Patient ID: Evan Keller MRN: [id number] DOB/AGE: Nov 14, 1970 40 y.o.  Admit date: 02/01/2011 Discharge date: 02/05/2011  Discharge Diagnoses:  Principal Problem: Nausea & vomiting Active Problems:  Depression  Back pain, chronic  ETOH abuse  Essential hypertension, benign  Macrocytosis without anemia  Chronic alcoholic hepatitis  Fatty liver, alcoholic  Tobacco abuse   Discharge Medication List as of 02/05/2011  1:21 PM    CONTINUE these medications which have NOT CHANGED   Details  ALPRAZolam (XANAX) 1 MG tablet Take 1 mg by mouth 3 (three) times daily as needed. anxiety , Starting 09/01/2010, Until Discontinued, Historical Med    amLODipine (NORVASC) 5 MG tablet Take 1 tablet (5 mg total) by mouth daily., Starting 10/13/2010, Until Discontinued, Normal    carisoprodol (SOMA) 350 MG tablet Take 1 tablet (350 mg total) by mouth 4 (four) times daily as needed., Starting 09/01/2010, Until Discontinued, Print    folic acid (FOLVITE) 1 MG tablet Take 1 tablet (1 mg total) by mouth daily., Starting 12/18/2010, Until Fri 12/18/11, Normal    ondansetron (ZOFRAN) 4 MG tablet Take 4 mg by mouth every 8 (eight) hours as needed. nausea, Starting 12/11/2010, Until Discontinued, Historical Med    pantoprazole (PROTONIX) 40 MG tablet Take 1 tablet (40 mg total) by mouth daily., Starting 01/28/2011, Until Thu 01/28/12, Normal    promethazine (PHENERGAN) 25 MG tablet Take 25 mg by mouth every 6 (six) hours as needed. nausea, Until Discontinued, Historical Med    sertraline (ZOLOFT) 50 MG tablet 1 tab a day, Normal    traMADol (ULTRAM) 50 MG tablet Take 1 tablet (50 mg total) by mouth every 6 (six) hours as needed for pain. Maximum dose= 8 tablets per day, Starting 01/12/2011, Until Tue 01/12/12, Normal    zolpidem (AMBIEN) 10 MG tablet Take 1 tablet (10 mg total) by mouth at bedtime as needed., Starting 09/01/2010, Until Discontinued, Print      STOP taking  these medications     dronabinol (MARINOL) 2.5 MG capsule         Discharge Orders    Future Orders Please Complete By Expires   Diet general      Increase activity slowly         Follow-up Information    Follow up with [facility] in 1 week. (call for Appointment)    Contact information:   [address] [place] [address] [phone]      Follow up with KNAPP,EVE A, MD in 1 week.   Contact information:   [address] [place] [address] [phone]          Disposition: Home or Self Care  Discharged Condition: Stable  Procedure: Liver biopsy.   Consults: gastroenterology Interventional radiology for liver biopsy  HPI: Patient is a 40 year old white male past medical history of heavy alcohol abuse:( 3-4 bottles of wine a day for 20 years) who in the past 6 months has been evaluated and managed for alcoholic hepatitis, cirrhosis and pancreatitis. He has had some workup looking for alternative causes as well. While he has scaled back his drinking, he still continues to drink. He says for the last few days he has not been able to keep anything down. Nevertheless, he still has had 3-4 glasses of beer, but then says he throws them back up. He also complains of abdominal pain throughout his entire abdomen has been going on now for several weeks, although it has presented for  the past few months.  In the emergency room patient was noted to have an alkaline phosphatase of 397, normal lipase at 43, and mild transaminitis with a bilirubin of 6.2. Interestingly, these labs were similar to last week when he was being seen as an outpatient. As per GI recommendations, he had an abdominal ultrasound. This noted chronic diffuse fatty infiltration of the liver. Pancreas appeared to be normal. It was otherwise unremarkable   PMH:  As per H & P FH: As per  H & P SH: As per H & P Med: as per H & P Allergies and ROS: as per H&P    Discharge Exam: Blood pressure 136/80, pulse 66, temperature 97.9 F (36.6 C), temperature source Oral, resp. rate 18, height 6\' 3"  (1.905 m), weight 99.791 kg (220 lb), SpO2 99.00%.  General: Alert, awake, oriented x3, in no acute distress.  HEENT: No bruits, no goiter.  Heart: Regular rate and rhythm, without murmurs, rubs, gallops.  Lungs: Crackles left side, bilateral air movement.  Abdomen: No guarding, nd, positive bowel sounds.  Neuro: Grossly intact, nonfocal.  Hospital Course:  Principal Problem: Nausea & vomiting His nausea and vomiting has improved. He has been able to tolerate his diet without any problems. He still complains of some pain across his abdomen.  Active Problems:  Depression Stable  Back pain, chronic Continue his Ultram   ETOH abuse Patient was told to refrain from any alcohol use. He states that he will not drink anymore secondary to all his problems. He will followup with his regular doctor.   Essential hypertension, benign Stable  Macrocytosis without anemia Secondary to alcohol use. He will continue with Folic acid   Chronic alcoholic hepatitis LFTs are trending down.    Tobacco abuse Encourage cessation. Labs:   Results for orders placed during the hospital encounter of 02/01/11 (from the past 48 hour(s))  HEPATIC FUNCTION PANEL     Status: Abnormal   Collection Time   02/05/11  3:45 AM      Component Value Range Comment   Total Protein 6.0  6.0 - 8.3 (g/dL)    Albumin 2.5 (*) 3.5 - 5.2 (g/dL)    AST 77 (*) 0 - 37 (U/L)    ALT 55 (*) 0 - 53 (U/L)    Alkaline Phosphatase 303 (*) 39 - 117 (U/L)    Total Bilirubin 4.6 (*) 0.3 - 1.2 (mg/dL)    Bilirubin, Direct 2.4 (*) 0.0 - 0.3 (mg/dL)    Indirect Bilirubin 2.2 (*) 0.3 - 0.9 (mg/dL)     Diagnostics:  Nm Gastric Emptying  01/23/2011  *RADIOLOGY REPORT*  Clinical Data:  Abdominal pain  NUCLEAR MEDICINE GASTRIC EMPTYING SCAN  Technique:  After oral ingestion of radiolabeled meal,  sequential abdominal images were obtained for 120 minutes.  Residual percentage of activity remaining within the stomach was calculated at 60 and 120 minutes.  Radiopharmaceutical: 2.0 mCi Tc-53m sulfur colloid.  Comparison:  None.  Findings: Gastric retention at one and 2 hours is 89% and 45% respectively.  Normal retention at 2 hours is less than 30%.  IMPRESSION: The study is compatible with delayed gastric emptying.  Original Report Authenticated By: Donavan Burnet, M.D.   US Abdomen Complete  02/01/2011  *RADIOLOGY REPORT*  Clinical Data:  Elevated liver function studies.  COMPLETE ABDOMINAL ULTRASOUND  Comparison:  CT scan 12/16/2010.  Findings:  Gallbladder:  Surgically absent.  Common bile duct:  Normal in caliber status post cholecystectomymeasuring a maximum of 6.31mm.  Liver:  There is diffuse increased echogenicity of the liver and decreased through transmission consistent with fatty infiltration. No focal lesions or biliary dilatation.  IVC:  Normal caliber.  Pancreas:  Sonographically normal.  Spleen:  Normal size and echogenicity without focal lesions.  Right Kidney:  13.2 cm in length. Normal renal cortical thickness and echogenicity without focal lesions or hydronephrosis.  Left Kidney:  11.7 cm in length. Normal renal cortical thickness and echogenicity without focal lesions or hydronephrosis.  Abdominal aorta:  Normal caliber.  IMPRESSION:  1.  Status post cholecystectomy with normal caliber common bile duct. 2.  Diffuse fatty infiltration of the liver.  Original Report Authenticated By: P. Loralie Champagne, M.D.   Mr 3d Recon At Scanner  01/17/2011  *RADIOLOGY REPORT*  Clinical Data: Abdominal pain, elevated LFTs.  Vomiting, weight loss.  Prior cholecystectomy.  MRI ABDOMEN WITHOUT AND WITH CONTRAST (MRCP)  Technique:  Multiplanar multisequence MR imaging of the abdomen was performed without and with contrast, including heavily T2-weighted images of the biliary and pancreatic ducts.   Three-dimensional MR images were rendered by post processing of the original MR data.  Contrast: 20mL MULTIHANCE GADOBENATE DIMEGLUMINE 529 MG/ML IV SOLN  Comparison:  CT 12/16/2010  Findings:  No pericardial fluid or pleural fluid.  No intrahepatic biliary ductal dilatation.  The common hepatic duct and common bile duct are within normal limits.  The common bile duct measures 7 mm.  This mild dilatation is likely related to prior cholecystectomy.  There are no filling defects within the common bile duct.  The pancreatic duct is likewise normal caliber. Normal ductal anatomy.  There is mild diffuse hepatic steatosis.  No focal hepatic lesion. Prior cholecystectomy.  Pancreas, spleen, adrenal glands, and kidneys are normal.  The stomach limited view of the small bowel and colon are remarkable.  No evidence of adenopathy.  No osseous abnormality.  Contrast enhanced series demonstrates no enhancing hepatic lesion. No evidence of fibrosis.  IMPRESSION:  1.  No evidence of biliary obstruction.  No filling defects within the common bile duct. 2.  Mild hepatic steatosis.  3.  Cholecystectomy.  Original Report Authenticated By: Genevive Bi, M.D.   US Biopsy  02/03/2011  *RADIOLOGY REPORT*  Clinical: Alcoholic hepatitis, elevated bilirubin, abnormal LFTs  ULTRASOUND GUIDED CORE LIVER BIOPSY  Sedation:  4 mg IV Versed; 200 mcg IV Fentanyl  Total Moderate Sedation Time:  15 minutes minutes.  An ultrasound guided liver biopsy was thoroughly discussed with the patient and questions were answered.  The benefits, risks, alternatives, and complications were also discussed.  The patient understands and wishes to proceed with the procedure.  Written consent was obtained.  Ultrasound of the liver was performed and an appropriate skin entry site was determined.  Skin site was marked, prepped with Betadine, and draped in the usual sterile fashion.  Local anesthesia was provided with 1% Lidocaine.  A 17 gauge trocar needle was  advanced under ultrasound guidance into the liver.  A total of 2 coaxial 18 gauge core samples were then obtained through the guide needle. The guide needle was removed. Post procedure scans were obtained.  Complications:  No immediate  Findings:  Ultrasound imaging confirms needle placed in the right hepatic lobe.  2  18 gauge core biopsies obtained.  IMPRESSION: Successful ultrasound guided random core biopsy of the liver.  Original Report Authenticated By: Judie Petit. Ruel Favors, M.D.   Mr Abd W/wo Cm/mrcp  01/17/2011  *RADIOLOGY REPORT*  Clinical Data: Abdominal pain, elevated LFTs.  Vomiting, weight loss.  Prior cholecystectomy.  MRI ABDOMEN WITHOUT AND WITH CONTRAST (MRCP)  Technique:  Multiplanar multisequence MR imaging of the abdomen was performed without and with contrast, including heavily T2-weighted images of the biliary and pancreatic ducts.  Three-dimensional MR images were rendered by post processing of the original MR data.  Contrast: 20mL MULTIHANCE GADOBENATE DIMEGLUMINE 529 MG/ML IV SOLN  Comparison:  CT 12/16/2010  Findings:  No pericardial fluid or pleural fluid.  No intrahepatic biliary ductal dilatation.  The common hepatic duct and common bile duct are within normal limits.  The common bile duct measures 7 mm.  This mild dilatation is likely related to prior cholecystectomy.  There are no filling defects within the common bile duct.  The pancreatic duct is likewise normal caliber. Normal ductal anatomy.  There is mild diffuse hepatic steatosis.  No focal hepatic lesion. Prior cholecystectomy.  Pancreas, spleen, adrenal glands, and kidneys are normal.  The stomach limited view of the small bowel and colon are remarkable.  No evidence of adenopathy.  No osseous abnormality.  Contrast enhanced series demonstrates no enhancing hepatic lesion. No evidence of fibrosis.  IMPRESSION:  1.  No evidence of biliary obstruction.  No filling defects within the common bile duct. 2.  Mild hepatic steatosis. 3.   Cholecystectomy.  Original Report Authenticated By: Genevive Bi, M.D.     SignedEarlene Plater MD, [facility] 02/05/2011, 6:13 PM  Time spent with patient and doing this discharge is approximately 35 minutes.

## 2011-02-13 ENCOUNTER — Telehealth: Payer: Self-pay | Admitting: *Deleted

## 2011-02-13 DIAGNOSIS — R945 Abnormal results of liver function studies: Secondary | ICD-10-CM

## 2011-02-13 NOTE — Telephone Encounter (Signed)
Message copied by Daphine Deutscher on Fri Feb 13, 2011 10:02 AM ------      Message from: Meredith Pel      Created: Thu Feb 12, 2011  2:53 PM       Rene Kocher, please let patient know that his last LFTs were better but we need another set. Monday morning will be okay. Results need to go to Dr. Rhea Belton.       Thanks      ----- Message -----         From: Rob Bunting, MD         Sent: 02/04/2011  12:38 PM           To: Meredith Pel, NP            i signed off him today, can you make sure he is set for LFTs early next week at our office.  Put my name on it and I will forward to [place].  The Tb 6.9 was yesterday, I ordered repeat lfts tomorrow.

## 2011-02-13 NOTE — Telephone Encounter (Signed)
Labs in EPIC. Left a message for patient at home and cell number to call me back.

## 2011-02-13 NOTE — Telephone Encounter (Signed)
Spoke with patient and he will not be back in town until Wednesday. He will come on Wednesday for repeat labs.

## 2011-02-18 ENCOUNTER — Telehealth: Payer: Self-pay | Admitting: Internal Medicine

## 2011-02-18 NOTE — Telephone Encounter (Signed)
Left a message for patient that Dr. Rhea Belton has not sent any path report results for [place] to give him yet. Patient would like the liver bx results.

## 2011-02-18 NOTE — Telephone Encounter (Signed)
Liver bx results are back - they reveal alcohol related liver cell injury.  Alcohol is felt to explain his liver dysfunction and jaundice. Special stains were done for iron and alpha-one antitrypsin (genetic causes of liver disease) and these were normal and still pointed to alcohol. Again, it is imperative that he cease all etoh consumption, and if necessary seek treatment for etoh use/cessation. We can provide a list of etoh-related resources if he so desires.

## 2011-02-19 NOTE — Telephone Encounter (Signed)
Spoke with patient and gave him results and recommendations as per Dr. Rhea Belton. Patient states he has not had any alcohol in 17 days. He declines any need for etho-related resources. Encouraged patient to seek help in etho cessation.

## 2011-02-24 ENCOUNTER — Telehealth: Payer: Self-pay | Admitting: *Deleted

## 2011-02-24 NOTE — Telephone Encounter (Signed)
Patient has not had labs as requested. Called and left a message for patient to call me.

## 2011-02-24 NOTE — Telephone Encounter (Signed)
Message copied by Daphine Deutscher on Tue Feb 24, 2011  9:07 AM ------      Message from: Daphine Deutscher      Created: Fri Feb 13, 2011 12:13 PM       Did patient have LFT done for PG(sent to pyrtle)

## 2011-02-24 NOTE — Telephone Encounter (Signed)
Spoke with patient and he states he has been very busy at work. He states he will come for labs by this Thursday. Explained to patient that it is very important to keep lab appointments. He states he is not drinking. His only compliant is that he is having diarrhea stools several times a day. He is noticing that he is having diarrhea within 5 minutes of eating.

## 2011-02-26 ENCOUNTER — Other Ambulatory Visit (INDEPENDENT_AMBULATORY_CARE_PROVIDER_SITE_OTHER): Payer: Managed Care, Other (non HMO)

## 2011-02-26 ENCOUNTER — Telehealth: Payer: Self-pay | Admitting: Internal Medicine

## 2011-02-26 DIAGNOSIS — K859 Acute pancreatitis without necrosis or infection, unspecified: Secondary | ICD-10-CM

## 2011-02-26 DIAGNOSIS — R110 Nausea: Secondary | ICD-10-CM

## 2011-02-26 DIAGNOSIS — R1110 Vomiting, unspecified: Secondary | ICD-10-CM

## 2011-02-26 DIAGNOSIS — R945 Abnormal results of liver function studies: Secondary | ICD-10-CM

## 2011-02-26 DIAGNOSIS — R7989 Other specified abnormal findings of blood chemistry: Secondary | ICD-10-CM

## 2011-02-26 DIAGNOSIS — R197 Diarrhea, unspecified: Secondary | ICD-10-CM

## 2011-02-26 LAB — HEPATIC FUNCTION PANEL
ALT: 14 U/L (ref 0–53)
AST: 28 U/L (ref 0–37)
Alkaline Phosphatase: 81 U/L (ref 39–117)
Bilirubin, Direct: 0.5 mg/dL — ABNORMAL HIGH (ref 0.0–0.3)
Total Bilirubin: 1.3 mg/dL — ABNORMAL HIGH (ref 0.3–1.2)

## 2011-02-26 MED ORDER — TRAMADOL HCL 50 MG PO TABS: ORAL_TABLET | ORAL | Status: DC

## 2011-02-26 NOTE — Telephone Encounter (Signed)
Spoke with patient and he is calling to let us know he had his labs drawn today. He also is asking if Dr. Rhea Belton will refill his Tramadol. He has #21 pills left.( He received # 90 with one refill on 01/12/11.) Please, advise.

## 2011-02-26 NOTE — Telephone Encounter (Signed)
Patient given lab results as per Dr. Pyrtle. 

## 2011-02-26 NOTE — Telephone Encounter (Signed)
Spoke with Dr. Rhea Belton and patient's labs are much improved and okay to refill Tramadol. Rx sent to pharmacy

## 2011-04-13 ENCOUNTER — Emergency Department (HOSPITAL_COMMUNITY)
Admission: EM | Admit: 2011-04-13 | Discharge: 2011-04-14 | Disposition: A | Discharge status: Home / Self Care | Payer: Managed Care, Other (non HMO) | Attending: Emergency Medicine | Admitting: Emergency Medicine

## 2011-04-13 ENCOUNTER — Encounter (HOSPITAL_COMMUNITY): Payer: Self-pay | Admitting: Emergency Medicine

## 2011-04-13 DIAGNOSIS — K859 Acute pancreatitis without necrosis or infection, unspecified: Secondary | ICD-10-CM

## 2011-04-13 DIAGNOSIS — R1110 Vomiting, unspecified: Secondary | ICD-10-CM

## 2011-04-13 DIAGNOSIS — I10 Essential (primary) hypertension: Secondary | ICD-10-CM

## 2011-04-13 DIAGNOSIS — R112 Nausea with vomiting, unspecified: Secondary | ICD-10-CM | POA: Insufficient documentation

## 2011-04-13 DIAGNOSIS — F3289 Other specified depressive episodes: Secondary | ICD-10-CM | POA: Insufficient documentation

## 2011-04-13 DIAGNOSIS — R109 Unspecified abdominal pain: Secondary | ICD-10-CM | POA: Insufficient documentation

## 2011-04-13 DIAGNOSIS — F329 Major depressive disorder, single episode, unspecified: Secondary | ICD-10-CM

## 2011-04-13 DIAGNOSIS — R197 Diarrhea, unspecified: Secondary | ICD-10-CM

## 2011-04-13 DIAGNOSIS — F32A Depression, unspecified: Secondary | ICD-10-CM

## 2011-04-13 DIAGNOSIS — G4700 Insomnia, unspecified: Secondary | ICD-10-CM | POA: Insufficient documentation

## 2011-04-13 DIAGNOSIS — R110 Nausea: Secondary | ICD-10-CM

## 2011-04-13 LAB — RAPID URINE DRUG SCREEN, HOSP PERFORMED
Amphetamines: NOT DETECTED
Cocaine: NOT DETECTED
Opiates: NOT DETECTED
Tetrahydrocannabinol: NOT DETECTED

## 2011-04-13 LAB — URINALYSIS, ROUTINE W REFLEX MICROSCOPIC
Bilirubin Urine: NEGATIVE
Glucose, UA: NEGATIVE mg/dL
Hgb urine dipstick: NEGATIVE
Protein, ur: NEGATIVE mg/dL
Urobilinogen, UA: 0.2 mg/dL (ref 0.0–1.0)

## 2011-04-13 LAB — CBC
HCT: 48.5 % (ref 39.0–52.0)
MCH: 36.6 pg — ABNORMAL HIGH (ref 26.0–34.0)
MCHC: 35.1 g/dL (ref 30.0–36.0)
MCV: 104.3 fL — ABNORMAL HIGH (ref 78.0–100.0)
Platelets: 286 10*3/uL (ref 150–400)
RDW: 11.6 % (ref 11.5–15.5)
WBC: 6.1 10*3/uL (ref 4.0–10.5)

## 2011-04-13 LAB — COMPREHENSIVE METABOLIC PANEL
AST: 23 U/L (ref 0–37)
Albumin: 4.2 g/dL (ref 3.5–5.2)
BUN: 5 mg/dL — ABNORMAL LOW (ref 6–23)
Calcium: 9.8 mg/dL (ref 8.4–10.5)
Creatinine, Ser: 0.71 mg/dL (ref 0.50–1.35)
Total Protein: 8.1 g/dL (ref 6.0–8.3)

## 2011-04-13 LAB — ETHANOL: Alcohol, Ethyl (B): 267 mg/dL — ABNORMAL HIGH (ref 0–11)

## 2011-04-13 MED ORDER — ONDANSETRON HCL 4 MG PO TABS: 4.0000 mg | ORAL_TABLET | Freq: Three times a day (TID) | ORAL | Status: DC | PRN

## 2011-04-13 MED ORDER — SERTRALINE HCL 50 MG PO TABS: 50.0000 mg | ORAL_TABLET | Freq: Every day | ORAL | Status: DC

## 2011-04-13 MED ORDER — LORAZEPAM 1 MG PO TABS
1.0000 mg | ORAL_TABLET | Freq: Three times a day (TID) | ORAL | Status: DC | PRN
  Administered 2011-04-14: 1 mg via ORAL
  Filled 2011-04-13: qty 1

## 2011-04-13 MED ORDER — ALPRAZOLAM 1 MG PO TABS: 1.0000 mg | ORAL_TABLET | Freq: Three times a day (TID) | ORAL | Status: DC | PRN

## 2011-04-13 MED ORDER — AMLODIPINE BESYLATE 5 MG PO TABS
5.0000 mg | ORAL_TABLET | Freq: Every day | ORAL | Status: DC
  Administered 2011-04-14: 5 mg via ORAL
  Filled 2011-04-13: qty 1

## 2011-04-13 MED ORDER — TRAMADOL HCL 50 MG PO TABS
50.0000 mg | ORAL_TABLET | Freq: Four times a day (QID) | ORAL | Status: DC
  Administered 2011-04-14 (×2): 50 mg via ORAL
  Filled 2011-04-13 (×2): qty 1

## 2011-04-13 MED ORDER — PANTOPRAZOLE SODIUM 40 MG PO TBEC
40.0000 mg | DELAYED_RELEASE_TABLET | Freq: Every day | ORAL | Status: DC
  Administered 2011-04-14: 40 mg via ORAL
  Filled 2011-04-13: qty 1

## 2011-04-13 MED ORDER — ZOLPIDEM TARTRATE 10 MG PO TABS
10.0000 mg | ORAL_TABLET | Freq: Every evening | ORAL | Status: DC | PRN
  Administered 2011-04-14: 10 mg via ORAL
  Filled 2011-04-13: qty 1

## 2011-04-13 MED ORDER — NICOTINE 21 MG/24HR TD PT24
21.0000 mg | MEDICATED_PATCH | Freq: Every day | TRANSDERMAL | Status: DC
  Administered 2011-04-14: 21 mg via TRANSDERMAL
  Filled 2011-04-13: qty 1

## 2011-04-13 MED ORDER — FOLIC ACID 1 MG PO TABS
1.0000 mg | ORAL_TABLET | Freq: Every day | ORAL | Status: DC
  Administered 2011-04-14: 1 mg via ORAL
  Filled 2011-04-13: qty 1

## 2011-04-13 NOTE — ED Notes (Signed)
Pt alert, nad, c/o depression, pt states recent life stressors, stres at home, denies SI/HI

## 2011-04-13 NOTE — ED Notes (Signed)
Attempt to call report, Rn unavailable

## 2011-04-13 NOTE — ED Notes (Signed)
Pt given blue scrubs and asked to place all belongings in bag

## 2011-04-13 NOTE — ED Notes (Signed)
MD at bedside. 

## 2011-04-14 LAB — COMPREHENSIVE METABOLIC PANEL
ALT: 17 U/L (ref 0–53)
Alkaline Phosphatase: 62 U/L (ref 39–117)
BUN: 6 mg/dL (ref 6–23)
CO2: 26 mEq/L (ref 19–32)
Calcium: 9.6 mg/dL (ref 8.4–10.5)
GFR calc Af Amer: >90 mL/min (ref >90)
GFR calc non Af Amer: >90 mL/min (ref >90)
Glucose, Bld: 117 mg/dL — ABNORMAL HIGH (ref 70–99)
Potassium: 3.9 mEq/L (ref 3.5–5.1)
Sodium: 141 mEq/L (ref 135–145)
Total Protein: 7.6 g/dL (ref 6.0–8.3)

## 2011-04-14 LAB — LIPASE, BLOOD: Lipase: 61 U/L — ABNORMAL HIGH (ref 11–59)

## 2011-04-14 MED ORDER — ESCITALOPRAM OXALATE 10 MG PO TABS: 10.0000 mg | ORAL_TABLET | Freq: Every day | ORAL | Status: DC

## 2011-04-14 MED ORDER — OXYCODONE-ACETAMINOPHEN 5-325 MG PO TABS
2.0000 | ORAL_TABLET | Freq: Four times a day (QID) | ORAL | Status: DC | PRN
  Administered 2011-04-14: 2 via ORAL
  Filled 2011-04-14: qty 2
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
## 2011-04-14 NOTE — Discharge Instructions (Signed)
Followup with outpatient alcohol treatment programs. Psychiatry is recommend starting Lexapro 10 mg each morning prescription provided for that. Resource guide provided.  RESOURCE GUIDE  Dental Problems  Patients with Medicaid: [facility] [phone] [address]                                           [phone] [address] Phone:  [phone]                                                  Phone:  [phone]  If unable to pay or uninsured, contact:  [facility] or [facility] to become qualified for the adult dental clinic.  Chronic Pain Problems Contact [facility]  [phone] Patients need to be referred by their primary care doctor.  Insufficient Money for Medicine Contact [employer]:  call "211" or [facility] [phone].  No Primary Care Doctor Call [facility]  [phone] Other agencies that provide inexpensive medical care    [facility]  [phone]    [facility]  [phone]    [facility]  [phone]    [facility]  [phone]    [facility]  [phone]    [facility]  [phone]  Psychological Services [facility]  [phone] [facility]  [phone] [facility]   [phone] (emergency services [phone])  Substance Abuse Resources [facility]  [phone] [facility] [phone] [facility] [phone] Floydene Flock [phone] Residential & Outpatient Substance Abuse Program  [phone]  Abuse/Neglect [facility] Child Abuse Hotline 972 161 3917 [facility] Child Abuse Hotline (517)185-8264 (After Hours)  Emergency Shelter [facility] [phone]  [facility] [place] [facility] [phone] [facility] [phone]  MRSA Hotline #:   2236502385    [facility]  Resources  [facility] [place] [facility]     [employer]                          [facility] Dept. [address] [place]                       [address]      [address]  [place]                                                Cristobal Goldmann Phone:  [phone]                                   Phone:  [phone]  Phone:  [phone]  [facility] Mental Health Phone:  [phone]  [facility] Child Abuse Hotline [phone] (After Hours)

## 2011-04-14 NOTE — ED Notes (Signed)
Telepsych completed.  

## 2011-04-14 NOTE — ED Provider Notes (Addendum)
Patient awaiting evaluation by acting for suicidal ideation. Patient requesting pain medicine does have a history of alcohol and substance abuse however is normally on oxycodone 30 mg tablets every 4 hours as needed for a ruptured disc. Will prescribe Percocet 10 mg available to take every 6 hours as needed.  Shelda Jakes, MD 04/14/11 [phone]   Addendum:  Patient interviewed by telemedicine psychiatry cleared for discharge to an outpatient alcohol treatment program and recommend starting Lexapro 10 mg each morning prescription provided for that.  Shelda Jakes, MD 04/14/11 1055
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
## 2011-04-14 NOTE — ED Provider Notes (Signed)
Medical screening examination/treatment/procedure(s) were performed by non-physician practitioner and as supervising physician I was immediately available for consultation/collaboration.   Latrece Nitta D Kendalyn Cranfield, MD 04/14/11 2124 

## 2011-04-15 ENCOUNTER — Encounter: Payer: Managed Care, Other (non HMO) | Admitting: Family Medicine

## 2011-06-23 ENCOUNTER — Emergency Department (HOSPITAL_COMMUNITY): Payer: Commercial Managed Care - PPO

## 2011-06-23 ENCOUNTER — Encounter (HOSPITAL_COMMUNITY): Payer: Self-pay | Admitting: Emergency Medicine

## 2011-06-23 ENCOUNTER — Emergency Department (HOSPITAL_COMMUNITY)
Admission: EM | Admit: 2011-06-23 | Discharge: 2011-06-24 | Disposition: A | Discharge status: Other Acute Inpatient Hospital | Payer: Commercial Managed Care - PPO | Attending: Psychiatry | Admitting: Psychiatry

## 2011-06-23 DIAGNOSIS — R45851 Suicidal ideations: Secondary | ICD-10-CM | POA: Insufficient documentation

## 2011-06-23 DIAGNOSIS — F1010 Alcohol abuse, uncomplicated: Secondary | ICD-10-CM | POA: Insufficient documentation

## 2011-06-23 DIAGNOSIS — F1120 Opioid dependence, uncomplicated: Secondary | ICD-10-CM | POA: Insufficient documentation

## 2011-06-23 DIAGNOSIS — S0081XA Abrasion of other part of head, initial encounter: Secondary | ICD-10-CM

## 2011-06-23 DIAGNOSIS — IMO0002 Reserved for concepts with insufficient information to code with codable children: Secondary | ICD-10-CM | POA: Insufficient documentation

## 2011-06-23 DIAGNOSIS — S61011A Laceration without foreign body of right thumb without damage to nail, initial encounter: Secondary | ICD-10-CM

## 2011-06-23 DIAGNOSIS — S61209A Unspecified open wound of unspecified finger without damage to nail, initial encounter: Secondary | ICD-10-CM | POA: Insufficient documentation

## 2011-06-23 LAB — CBC
MCH: 35.6 pg — ABNORMAL HIGH (ref 26.0–34.0)
MCHC: 36.4 g/dL — ABNORMAL HIGH (ref 30.0–36.0)
MCV: 97.8 fL (ref 78.0–100.0)
Platelets: 250 10*3/uL (ref 150–400)
RBC: 5.03 MIL/uL (ref 4.22–5.81)
RDW: 12.7 % (ref 11.5–15.5)

## 2011-06-23 LAB — BASIC METABOLIC PANEL
CO2: 23 mEq/L (ref 19–32)
Calcium: 9.2 mg/dL (ref 8.4–10.5)
Creatinine, Ser: 0.83 mg/dL (ref 0.50–1.35)

## 2011-06-23 LAB — RAPID URINE DRUG SCREEN, HOSP PERFORMED: Amphetamines: NOT DETECTED

## 2011-06-23 LAB — ETHANOL: Alcohol, Ethyl (B): 239 mg/dL — ABNORMAL HIGH (ref 0–11)

## 2011-06-23 MED ORDER — ONDANSETRON HCL 4 MG PO TABS
4.0000 mg | ORAL_TABLET | Freq: Three times a day (TID) | ORAL | Status: DC | PRN
  Administered 2011-06-24 (×2): 4 mg via ORAL
  Filled 2011-06-23 (×2): qty 1

## 2011-06-23 MED ORDER — OXYCODONE HCL 5 MG PO TABS
10.0000 mg | ORAL_TABLET | Freq: Once | ORAL | Status: AC
  Administered 2011-06-23: 10 mg via ORAL
  Filled 2011-06-23: qty 2

## 2011-06-23 MED ORDER — LORAZEPAM 1 MG PO TABS: 0.0000 mg | ORAL_TABLET | Freq: Two times a day (BID) | ORAL | Status: DC

## 2011-06-23 MED ORDER — LOPERAMIDE HCL 2 MG PO CAPS: 2.0000 mg | ORAL_CAPSULE | ORAL | Status: DC | PRN

## 2011-06-23 MED ORDER — HYDROXYZINE HCL 25 MG PO TABS
25.0000 mg | ORAL_TABLET | Freq: Four times a day (QID) | ORAL | Status: DC | PRN
  Administered 2011-06-24 (×2): 25 mg via ORAL
  Filled 2011-06-23 (×2): qty 1

## 2011-06-23 MED ORDER — LORAZEPAM 1 MG PO TABS
1.0000 mg | ORAL_TABLET | Freq: Four times a day (QID) | ORAL | Status: DC | PRN
  Administered 2011-06-24 (×2): 1 mg via ORAL
  Filled 2011-06-23 (×2): qty 1

## 2011-06-23 MED ORDER — LORAZEPAM 2 MG/ML IJ SOLN: 1.0000 mg | Freq: Four times a day (QID) | INTRAMUSCULAR | Status: DC | PRN

## 2011-06-23 MED ORDER — NAPROXEN 500 MG PO TABS
500.0000 mg | ORAL_TABLET | Freq: Two times a day (BID) | ORAL | Status: DC | PRN
  Administered 2011-06-24 (×3): 500 mg via ORAL
  Filled 2011-06-23 (×3): qty 1

## 2011-06-23 MED ORDER — ADULT MULTIVITAMIN W/MINERALS CH
1.0000 | ORAL_TABLET | Freq: Every day | ORAL | Status: DC
  Administered 2011-06-24: 1 via ORAL
  Filled 2011-06-23: qty 1

## 2011-06-23 MED ORDER — FOLIC ACID 1 MG PO TABS
1.0000 mg | ORAL_TABLET | Freq: Every day | ORAL | Status: DC
  Administered 2011-06-24: 1 mg via ORAL
  Filled 2011-06-23: qty 1

## 2011-06-23 MED ORDER — DICYCLOMINE HCL 20 MG PO TABS: 20.0000 mg | ORAL_TABLET | ORAL | Status: DC | PRN

## 2011-06-23 MED ORDER — OXYCODONE-ACETAMINOPHEN 5-325 MG PO TABS
1.0000 | ORAL_TABLET | Freq: Once | ORAL | Status: AC
  Administered 2011-06-24: 1 via ORAL
  Filled 2011-06-23: qty 1

## 2011-06-23 MED ORDER — VITAMIN B-1 100 MG PO TABS
100.0000 mg | ORAL_TABLET | Freq: Every day | ORAL | Status: DC
  Administered 2011-06-24: 100 mg via ORAL
  Filled 2011-06-23: qty 1

## 2011-06-23 MED ORDER — ONDANSETRON 4 MG PO TBDP: 4.0000 mg | ORAL_TABLET | Freq: Four times a day (QID) | ORAL | Status: DC | PRN

## 2011-06-23 MED ORDER — THIAMINE HCL 100 MG/ML IJ SOLN: 100.0000 mg | Freq: Every day | INTRAMUSCULAR | Status: DC

## 2011-06-23 MED ORDER — IBUPROFEN 600 MG PO TABS: 600.0000 mg | ORAL_TABLET | Freq: Three times a day (TID) | ORAL | Status: DC | PRN

## 2011-06-23 MED ORDER — NICOTINE 21 MG/24HR TD PT24
21.0000 mg | MEDICATED_PATCH | Freq: Once | TRANSDERMAL | Status: DC
  Administered 2011-06-23: 21 mg via TRANSDERMAL
  Filled 2011-06-23 (×2): qty 1

## 2011-06-23 MED ORDER — ACETAMINOPHEN 325 MG PO TABS: 650.0000 mg | ORAL_TABLET | ORAL | Status: DC | PRN

## 2011-06-23 MED ORDER — METHOCARBAMOL 500 MG PO TABS
500.0000 mg | ORAL_TABLET | Freq: Three times a day (TID) | ORAL | Status: DC | PRN
  Administered 2011-06-24: 500 mg via ORAL
  Filled 2011-06-23: qty 1

## 2011-06-23 MED ORDER — LORAZEPAM 1 MG PO TABS
0.0000 mg | ORAL_TABLET | Freq: Four times a day (QID) | ORAL | Status: DC
  Administered 2011-06-24 (×2): 2 mg via ORAL
  Administered 2011-06-24: 1 mg via ORAL
  Filled 2011-06-23: qty 2
  Filled 2011-06-23 (×2): qty 1
  Filled 2011-06-23: qty 2

## 2011-06-23 MED ORDER — TETANUS-DIPHTH-ACELL PERTUSSIS 5-2.5-18.5 LF-MCG/0.5 IM SUSP
0.5000 mL | Freq: Once | INTRAMUSCULAR | Status: AC
  Administered 2011-06-23: 0.5 mL via INTRAMUSCULAR
  Filled 2011-06-23: qty 0.5

## 2011-06-23 MED ORDER — ZOLPIDEM TARTRATE 5 MG PO TABS: 5.0000 mg | ORAL_TABLET | Freq: Every evening | ORAL | Status: DC | PRN
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
## 2011-06-23 NOTE — ED Notes (Signed)
[employer] outside of room. Pt remains on backboard. Charge nurse melanie sts md is aware that pt needs to be removed from backboard

## 2011-06-23 NOTE — ED Notes (Signed)
Patient transported to X-ray.  Ambulatory w/[employer] to walk w/the pt.  Pt calm and cooperative and steady on feet.

## 2011-06-24 ENCOUNTER — Inpatient Hospital Stay (HOSPITAL_COMMUNITY)
Admission: AD | Admit: 2011-06-24 | Discharge: 2011-06-28 | DRG: 897 | Disposition: A | Discharge status: Home / Self Care | Payer: Commercial Managed Care - PPO | Source: Ambulatory Visit | Attending: Psychiatry | Admitting: Psychiatry

## 2011-06-24 ENCOUNTER — Encounter (HOSPITAL_COMMUNITY): Payer: Self-pay | Admitting: *Deleted

## 2011-06-24 DIAGNOSIS — F1994 Other psychoactive substance use, unspecified with psychoactive substance-induced mood disorder: Secondary | ICD-10-CM | POA: Diagnosis present

## 2011-06-24 DIAGNOSIS — F10988 Alcohol use, unspecified with other alcohol-induced disorder: Secondary | ICD-10-CM | POA: Diagnosis present

## 2011-06-24 DIAGNOSIS — M4800 Spinal stenosis, site unspecified: Secondary | ICD-10-CM | POA: Diagnosis present

## 2011-06-24 DIAGNOSIS — F1020 Alcohol dependence, uncomplicated: Secondary | ICD-10-CM

## 2011-06-24 DIAGNOSIS — F17200 Nicotine dependence, unspecified, uncomplicated: Secondary | ICD-10-CM | POA: Diagnosis present

## 2011-06-24 DIAGNOSIS — S0510XA Contusion of eyeball and orbital tissues, unspecified eye, initial encounter: Secondary | ICD-10-CM | POA: Diagnosis present

## 2011-06-24 DIAGNOSIS — Y92009 Unspecified place in unspecified non-institutional (private) residence as the place of occurrence of the external cause: Secondary | ICD-10-CM

## 2011-06-24 DIAGNOSIS — K700 Alcoholic fatty liver: Secondary | ICD-10-CM | POA: Diagnosis present

## 2011-06-24 DIAGNOSIS — F329 Major depressive disorder, single episode, unspecified: Secondary | ICD-10-CM

## 2011-06-24 DIAGNOSIS — F10239 Alcohol dependence with withdrawal, unspecified: Principal | ICD-10-CM | POA: Diagnosis present

## 2011-06-24 DIAGNOSIS — K7010 Alcoholic hepatitis without ascites: Secondary | ICD-10-CM | POA: Diagnosis present

## 2011-06-24 DIAGNOSIS — F411 Generalized anxiety disorder: Secondary | ICD-10-CM | POA: Diagnosis present

## 2011-06-24 DIAGNOSIS — Y998 Other external cause status: Secondary | ICD-10-CM

## 2011-06-24 DIAGNOSIS — Z79899 Other long term (current) drug therapy: Secondary | ICD-10-CM

## 2011-06-24 DIAGNOSIS — G4700 Insomnia, unspecified: Secondary | ICD-10-CM | POA: Diagnosis present

## 2011-06-24 DIAGNOSIS — S7000XA Contusion of unspecified hip, initial encounter: Secondary | ICD-10-CM | POA: Diagnosis present

## 2011-06-24 DIAGNOSIS — M129 Arthropathy, unspecified: Secondary | ICD-10-CM | POA: Diagnosis present

## 2011-06-24 DIAGNOSIS — F10939 Alcohol use, unspecified with withdrawal, unspecified: Principal | ICD-10-CM | POA: Diagnosis present

## 2011-06-24 MED ORDER — CHLORDIAZEPOXIDE HCL 25 MG PO CAPS
25.0000 mg | ORAL_CAPSULE | Freq: Four times a day (QID) | ORAL | Status: DC | PRN
  Administered 2011-06-25: 25 mg via ORAL
  Filled 2011-06-24: qty 1

## 2011-06-24 MED ORDER — ADULT MULTIVITAMIN W/MINERALS CH
1.0000 | ORAL_TABLET | Freq: Every day | ORAL | Status: DC
  Administered 2011-06-25 – 2011-06-28 (×4): 1 via ORAL
  Filled 2011-06-24 (×6): qty 1

## 2011-06-24 MED ORDER — NICOTINE 21 MG/24HR TD PT24
21.0000 mg | MEDICATED_PATCH | Freq: Every day | TRANSDERMAL | Status: DC
Start: 2011-06-25 — End: 2011-06-28
  Administered 2011-06-25 – 2011-06-28 (×4): 21 mg via TRANSDERMAL
  Filled 2011-06-24 (×7): qty 1

## 2011-06-24 MED ORDER — ACETAMINOPHEN 325 MG PO TABS: 650.0000 mg | ORAL_TABLET | Freq: Four times a day (QID) | ORAL | Status: DC | PRN

## 2011-06-24 MED ORDER — CHLORDIAZEPOXIDE HCL 25 MG PO CAPS
25.0000 mg | ORAL_CAPSULE | Freq: Four times a day (QID) | ORAL | Status: DC
  Administered 2011-06-24 – 2011-06-25 (×2): 25 mg via ORAL
  Filled 2011-06-24 (×2): qty 1

## 2011-06-24 MED ORDER — CHLORDIAZEPOXIDE HCL 25 MG PO CAPS: 25.0000 mg | ORAL_CAPSULE | Freq: Three times a day (TID) | ORAL | Status: DC

## 2011-06-24 MED ORDER — HYDROXYZINE HCL 25 MG PO TABS
25.0000 mg | ORAL_TABLET | Freq: Four times a day (QID) | ORAL | Status: DC | PRN
  Administered 2011-06-24: 25 mg via ORAL
  Filled 2011-06-24: qty 1

## 2011-06-24 MED ORDER — LOSARTAN POTASSIUM 50 MG PO TABS
50.0000 mg | ORAL_TABLET | Freq: Every day | ORAL | Status: DC
  Administered 2011-06-25 – 2011-06-28 (×3): 50 mg via ORAL
  Filled 2011-06-24 (×6): qty 1

## 2011-06-24 MED ORDER — VITAMIN B-1 100 MG PO TABS
100.0000 mg | ORAL_TABLET | Freq: Every day | ORAL | Status: DC
  Administered 2011-06-25 – 2011-06-28 (×4): 100 mg via ORAL
  Filled 2011-06-24 (×6): qty 1

## 2011-06-24 MED ORDER — THIAMINE HCL 100 MG/ML IJ SOLN: 100.0000 mg | Freq: Once | INTRAMUSCULAR | Status: DC

## 2011-06-24 MED ORDER — CHLORDIAZEPOXIDE HCL 25 MG PO CAPS: 25.0000 mg | ORAL_CAPSULE | ORAL | Status: DC

## 2011-06-24 MED ORDER — MAGNESIUM HYDROXIDE 400 MG/5ML PO SUSP: 30.0000 mL | Freq: Every day | ORAL | Status: DC | PRN

## 2011-06-24 MED ORDER — IBUPROFEN 800 MG PO TABS
800.0000 mg | ORAL_TABLET | Freq: Three times a day (TID) | ORAL | Status: DC | PRN
  Administered 2011-06-24: 800 mg via ORAL
  Filled 2011-06-24: qty 1

## 2011-06-24 MED ORDER — ONDANSETRON 4 MG PO TBDP: 4.0000 mg | ORAL_TABLET | Freq: Four times a day (QID) | ORAL | Status: DC | PRN

## 2011-06-24 MED ORDER — ALUM & MAG HYDROXIDE-SIMETH 200-200-20 MG/5ML PO SUSP: 30.0000 mL | ORAL | Status: DC | PRN

## 2011-06-24 MED ORDER — LOPERAMIDE HCL 2 MG PO CAPS: 2.0000 mg | ORAL_CAPSULE | ORAL | Status: AC | PRN

## 2011-06-24 MED ORDER — TRAZODONE HCL 50 MG PO TABS
50.0000 mg | ORAL_TABLET | Freq: Every evening | ORAL | Status: DC | PRN
  Administered 2011-06-24: 50 mg via ORAL
  Filled 2011-06-24: qty 1

## 2011-06-24 MED ORDER — CHLORDIAZEPOXIDE HCL 25 MG PO CAPS
25.0000 mg | ORAL_CAPSULE | Freq: Every day | ORAL | Status: DC
Start: 2011-06-29 — End: 2011-06-25

## 2011-06-24 NOTE — BH Assessment (Signed)
  Received a call from [facility] center requesting diagnosis codes for the patient.  Provided diagnosis codes for Alcohol Abuse 305.00 and 296.33 Major Depressive Disorder Recurrent Severe without psychotic features-the diagnoses given to the patient at the time of initial assessment by Beatrix Shipper.   Steward Ros 06/24/2011 9:31 PM

## 2011-06-24 NOTE — ED Notes (Addendum)
Pt has been accepted to [facility] by Lynann Bologna NP to Dr. Koren Shiver to bed 304-2. EDP notified and is in agreement with disposition. RN made aware to call report and set up transport with [employer] as pt is under IVC. Pt made aware of transfer and is in agreement. All support paperwork has been completed and faxed to [facility] for review. No further needs identified at this time.

## 2011-06-24 NOTE — ED Notes (Signed)
Pt states he was told by the Dr. Pincus Badder, that he did have to go to [facility] if he didn't want to. Writer discusses this with patient in length, explaining IVC process and describing safety protocols. Pt encouraged to transfer to [facility] for detox and stabilization. Pt states he will cooperate with whatever the EDP decides but does not want to go.  EDP states pt will be transferred to [facility] for follow up.
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
## 2011-06-24 NOTE — ED Provider Notes (Signed)
Pt has been accepted by Dr. Catha Brow.  Cheri Guppy, MD 06/24/11 2038
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
## 2011-06-24 NOTE — ED Notes (Signed)
[employer] arrives to transport pt across to [facility]. Pt calm and cooperative upon departure, leaves in safe and stable condition.

## 2011-06-25 DIAGNOSIS — F1020 Alcohol dependence, uncomplicated: Secondary | ICD-10-CM | POA: Diagnosis present

## 2011-06-25 DIAGNOSIS — F1120 Opioid dependence, uncomplicated: Secondary | ICD-10-CM

## 2011-06-25 DIAGNOSIS — F1994 Other psychoactive substance use, unspecified with psychoactive substance-induced mood disorder: Secondary | ICD-10-CM | POA: Diagnosis present

## 2011-06-25 MED ORDER — GABAPENTIN 300 MG PO CAPS
300.0000 mg | ORAL_CAPSULE | Freq: Two times a day (BID) | ORAL | Status: DC
  Administered 2011-06-25 – 2011-06-28 (×6): 300 mg via ORAL
  Filled 2011-06-25 (×11): qty 1

## 2011-06-25 MED ORDER — LORAZEPAM 1 MG PO TABS
1.0000 mg | ORAL_TABLET | Freq: Every day | ORAL | Status: AC
  Administered 2011-06-28: 1 mg via ORAL
  Filled 2011-06-25: qty 1

## 2011-06-25 MED ORDER — CITALOPRAM HYDROBROMIDE 20 MG PO TABS
20.0000 mg | ORAL_TABLET | Freq: Every day | ORAL | Status: DC
  Administered 2011-06-25 – 2011-06-28 (×4): 20 mg via ORAL
  Filled 2011-06-25 (×6): qty 1

## 2011-06-25 MED ORDER — CLONIDINE HCL 0.1 MG PO TABS
0.1000 mg | ORAL_TABLET | ORAL | Status: DC
  Administered 2011-06-27 – 2011-06-28 (×3): 0.1 mg via ORAL
  Filled 2011-06-25 (×4): qty 1

## 2011-06-25 MED ORDER — GABAPENTIN 600 MG PO TABS
300.0000 mg | ORAL_TABLET | Freq: Two times a day (BID) | ORAL | Status: DC
  Filled 2011-06-25 (×2): qty 0.5

## 2011-06-25 MED ORDER — LORAZEPAM 1 MG PO TABS
1.0000 mg | ORAL_TABLET | ORAL | Status: AC | PRN
  Administered 2011-06-25 – 2011-06-27 (×3): 1 mg via ORAL
  Filled 2011-06-25 (×4): qty 1

## 2011-06-25 MED ORDER — METHOCARBAMOL 500 MG PO TABS
500.0000 mg | ORAL_TABLET | Freq: Three times a day (TID) | ORAL | Status: DC | PRN
  Administered 2011-06-25 – 2011-06-28 (×9): 500 mg via ORAL
  Filled 2011-06-25 (×9): qty 1

## 2011-06-25 MED ORDER — LOPERAMIDE HCL 2 MG PO CAPS: 2.0000 mg | ORAL_CAPSULE | ORAL | Status: DC | PRN

## 2011-06-25 MED ORDER — LORAZEPAM 1 MG PO TABS
1.0000 mg | ORAL_TABLET | Freq: Three times a day (TID) | ORAL | Status: AC
  Administered 2011-06-26 (×3): 1 mg via ORAL
  Filled 2011-06-25 (×4): qty 1

## 2011-06-25 MED ORDER — DICYCLOMINE HCL 20 MG PO TABS
20.0000 mg | ORAL_TABLET | ORAL | Status: DC | PRN
  Filled 2011-06-25: qty 1

## 2011-06-25 MED ORDER — HYDROXYZINE HCL 25 MG PO TABS
25.0000 mg | ORAL_TABLET | Freq: Four times a day (QID) | ORAL | Status: DC | PRN
  Administered 2011-06-27: 25 mg via ORAL

## 2011-06-25 MED ORDER — NAPROXEN 500 MG PO TABS
500.0000 mg | ORAL_TABLET | Freq: Two times a day (BID) | ORAL | Status: DC | PRN
  Administered 2011-06-25 – 2011-06-28 (×7): 500 mg via ORAL
  Filled 2011-06-25 (×7): qty 1

## 2011-06-25 MED ORDER — CLONIDINE HCL 0.1 MG PO TABS
0.1000 mg | ORAL_TABLET | Freq: Four times a day (QID) | ORAL | Status: AC
  Administered 2011-06-25 – 2011-06-26 (×7): 0.1 mg via ORAL
  Filled 2011-06-25 (×7): qty 1

## 2011-06-25 MED ORDER — CLONIDINE HCL 0.1 MG PO TABS
0.1000 mg | ORAL_TABLET | Freq: Every day | ORAL | Status: DC
  Filled 2011-06-25 (×2): qty 1

## 2011-06-25 MED ORDER — TRAZODONE HCL 100 MG PO TABS: 100.0000 mg | ORAL_TABLET | Freq: Every evening | ORAL | Status: DC | PRN

## 2011-06-25 MED ORDER — ONDANSETRON 4 MG PO TBDP
4.0000 mg | ORAL_TABLET | Freq: Four times a day (QID) | ORAL | Status: DC | PRN
  Filled 2011-06-25: qty 1

## 2011-06-25 MED ORDER — LORAZEPAM 1 MG PO TABS
1.0000 mg | ORAL_TABLET | Freq: Two times a day (BID) | ORAL | Status: AC
  Administered 2011-06-27 (×2): 1 mg via ORAL
  Filled 2011-06-25 (×2): qty 1

## 2011-06-25 MED ORDER — LORAZEPAM 1 MG PO TABS
1.0000 mg | ORAL_TABLET | Freq: Three times a day (TID) | ORAL | Status: AC
  Administered 2011-06-25 (×4): 1 mg via ORAL
  Filled 2011-06-25 (×3): qty 1

## 2011-06-25 NOTE — Progress Notes (Signed)
06/25/2011         Time: 1415      Group Topic/Focus: The focus of this group is on enhancing patients' problem solving skills, which involves identifying the problem, brainstorming solutions and choosing and trying a solution.   Participation Level: Minimal  Participation Quality: Appropriate and Attentive  Affect: Blunted  Cognitive: Oriented   Additional Comments: Patient in and out of group, complaining of nausea. Patient participated well while in group.  Caylen Kuwahara 06/25/2011 3:12 PM

## 2011-06-25 NOTE — H&P (Signed)
Pt seen and evaluated upon admission.  Completed Admission Suicide Risk Assessment.  See orders.  Pt agreeable with plan.  Discussed with team.   

## 2011-06-25 NOTE — [facility] Suicide Risk Assessment (Signed)
Suicide Risk Assessment  Admission Assessment      Demographic factors:  See chart.  Current Mental Status:  Patient seen and evaluated. Chart reviewed. Patient stated that his mood was "ok". His affect was mood congruent and anxious. He denied any current thoughts of self injurious behavior, suicidal ideation or homicidal ideation. There were no auditory or visual hallucinations, paranoia, delusional thought processes, or mania noted.  Thought process was linear and goal directed.  Sig psychomotor agitation. His speech was pressured. Eye contact was good. Judgment and insight are fair.  Patient has been up and engaged on the unit.  No acute safety concerns reported from team.  Loss Factors: Legal issues;Financial problems / change in socioeconomic status  Historical Factors:  Access to firearms; no FamHx suicide; had gun for 17 yrs with no past usage on self or others; Account Executive -  Kathryne Sharper   Risk Reduction Factors: Sense of responsibility to family;Employed;Positive social support  CLINICAL FACTORS: Alcohol Dependence & W/D; Opioid Dependence & W/D; HTN; Chronic Pain  COGNITIVE FEATURES THAT CONTRIBUTE TO RISK: limited insight; impulsivity; minimization.   SUICIDE RISK: Pt viewed as a chronic increased risk of harm to self in light of his past hx and risk factors.  No acute safety concerns on the unit.  Pt contracting for safety and in need of crisis stabilization, detox & Tx.  PLAN OF CARE: Pt admitted for crisis stabilization, detox and treatment.  Please see orders.   Medications reviewed with pt and medication education provided.  Will continue q15 minute checks per unit protocol.  No clinical indication for one on one level of observation at this time.  Pt contracting for safety.  Mental health treatment, potential medication management and continued sobriety will mitigate against the increased risk of harm to self and/or others.  Discussed the importance of recovery with pt, as  well as, tools to move forward in a healthy & safe manner.  Pt agreeable with the plan.  Discussed with the team.   Lupe Carney 06/25/2011, 2:38 PM

## 2011-06-25 NOTE — Progress Notes (Addendum)
Patient reported that he is doing well. His mood and affect appropriate. He denied SI/HI, denied withdrawal symptoms and denied hallucinations. Wound on patient's finger cleaned with betadine to prevent infection. Q15 minute check continues to maintain safety.

## 2011-06-25 NOTE — Discharge Planning (Signed)
New patient is here for detox.  Has new job at [employer] and is anxious to get back there ASAP.  Declines IOP, but interested in referral to psychiatrist and therapist.  Will refer to [facility].

## 2011-06-25 NOTE — Progress Notes (Signed)
Patient ID: Evan Keller, male   DOB: [birth year], 41 y.o.   MRN: [id number] He has been  Up and about and to groups. Has c/o withdrawal symptoms and has received prn medication for pain, anxiety  And muscle aches.

## 2011-06-25 NOTE — Tx Team (Signed)
Initial Interdisciplinary Treatment Plan  PATIENT STRENGTHS: (choose at least two) Average or above average intelligence Capable of independent living General fund of knowledge Motivation for treatment/growth Supportive family/friends  PATIENT STRESSORS: Health problems Legal issue Occupational concerns Substance abuse   PROBLEM LIST: Problem List/Patient Goals Date to be addressed Date deferred Reason deferred Estimated date of resolution  Substance abuse-alcohol      Risk for self harm      Chronic back pain                                           DISCHARGE CRITERIA:  Ability to meet basic life and health needs Improved stabilization in mood, thinking, and/or behavior Motivation to continue treatment in a less acute level of care Verbal commitment to aftercare and medication compliance Withdrawal symptoms are absent or subacute and managed without 24-hour nursing intervention  PRELIMINARY DISCHARGE PLAN: Attend aftercare/continuing care group Attend 12-step recovery group Outpatient therapy Return to previous living arrangement Return to previous work or school arrangements  PATIENT/FAMIILY INVOLVEMENT: This treatment plan has been presented to and reviewed with the patient, Evan Keller, and/or family member.  The patient and family have been given the opportunity to ask questions and make suggestions.  [name] [facility] 06/25/2011, 1:01 AM

## 2011-06-25 NOTE — Progress Notes (Signed)
[facility] Group Notes:  (Counselor/Nursing/MHT/Case Management/Adjunct)  06/25/2011 5:52 PM  Type of Therapy:  Group Therapy  Participation Level:  Minimal  Participation Quality:  Attentive  Affect:  Anxious and Appropriate  Cognitive:  Oriented  Insight:  None shared  Engagement in Group:  Limited  Engagement in Therapy:  None  Modes of Intervention:  Orientation and Socialization  Summary of Progress/Problems:  Evan Keller attended first group therapy session of stay yet was called out to meet with medical staff.    Clide Dales 06/25/2011, 5:54 PM  [facility] Group Notes:  (Counselor/Nursing/MHT/Case Management/Adjunct)  06/25/2011 5:55 PM  Type of Therapy:  Group Therapy  Participation Level:  Did Not Attend   Clide Dales 06/25/2011, 5:55 PM

## 2011-06-25 NOTE — Progress Notes (Signed)
Invol admit to the 300 hall for alcohol abuse and reported overdose on Tramadol.  Pt became intoxicated and contacted his family saying he was going to shoot himself.  When police arrived, pt had a gun and would not put it down.  For a time pt had a stand-off with the police and then had to be physically taken down.  He has abrasions to the L side of his forehead and a cut which required sutures from that incident.  Pt reports he has been drinking for 20 yrs, excessively for the last 5 yrs, drinking 2 bottles of wine and 6-12 beers daily.  BAL was 239 on arrival to the ED.  Pt has also been overusing opiates and benzos per his family.  He is concerned that he may lose his job of 6 mons.  Medical hx includes arthritis, HTN, chronic back pain, spinal stenosis, pancreatitis, and insomnia.  He reports he used to be athletic, running marathons, but the alcohol took over his life.  Pt states he lives alone.  Family is very supportive.  Mother works at [employer].  Pt was cooperative with admission process which was completed and paperwork signed.  Pt declined a meal.  Oriented to unit/room.  Safety checks q15 minutes initiated.
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
## 2011-06-26 LAB — T4, FREE: Free T4: 0.8 ng/dL (ref 0.80–1.80)

## 2011-06-26 LAB — HEPATIC FUNCTION PANEL
AST: 33 U/L (ref 0–37)
Albumin: 4.2 g/dL (ref 3.5–5.2)
Alkaline Phosphatase: 56 U/L (ref 39–117)
Total Bilirubin: 0.5 mg/dL (ref 0.3–1.2)

## 2011-06-26 NOTE — Progress Notes (Addendum)
Patient attended discharge planning. Patient states that he is eager for discharge.  Patient requesting appointment be made that is closer to his home in [place]. Patient requesting his appointment be made week of the 20th for work issues.Reports that he has transportation and access to medications.Patient also requesting a letter that will allow him to be out of work until Wednesday 07/01/2011

## 2011-06-26 NOTE — Progress Notes (Signed)
[facility] Group Notes:  (Counselor/Nursing/MHT/Case Management/Adjunct)  06/26/2011 4:52 PM  Type of Therapy:  Group Therapy at 11:00  Participation Level:  Active  Participation Quality:  Appropriate, Attentive and Sharing  Affect:  Appropriate  Cognitive:  Appropriate  Insight:  Limited  Engagement in Group:  Limited  Engagement in Therapy:  Limited  Modes of Intervention:  Education, Socialization and Support  Summary of Progress/Problems:  [name] was attentive to discussion on Post Acute Withdrawal Syndrome (PAWS) and shared dismay that it can last for two years.  "These symptoms they describe are things that also make me want to use." "But you know the time I had sober was so much better than any day I ever drank."    Clide Dales 06/26/2011, 4:55 PM   [facility] Group Notes:  (Counselor/Nursing/MHT/Case Management/Adjunct)  06/26/2011 4:55 PM  Type of Therapy:  Group Therapy at 1:15  Participation Level:  Did Not Attend; but first let counselor know he was not feeling well  Clide Dales 06/26/2011, 4:56 PM

## 2011-06-26 NOTE — Progress Notes (Signed)
Pt alert and oriented times four.  Pt c/o tremors, nausea, and anxiety.  Morning medications administered. Pt has no other complaints and has plans to attend outpatient alcoholics anonymous meetings

## 2011-06-26 NOTE — Progress Notes (Signed)
[facility] MD Progress Note  06/26/2011 2:49 PM  S/O: Patient seen and evaluated in team.  Discussed TP.  Chart reviewed. Patient stated that his mood was "much better". His affect was mood congruent and less anxious. He denied any current thoughts of self injurious behavior, suicidal ideation or homicidal ideation. There were no auditory or visual hallucinations, paranoia, delusional thought processes, or mania noted. Thought process was linear and goal directed. Sig psychomotor agitation. His speech was less pressured. Eye contact was good. Judgment and insight are fair. Patient has been up and engaged on the unit. No acute safety concerns reported from team.   Sleep:  Number of Hours: 6.5    Vital Signs:Blood pressure 132/91, pulse 99, temperature 98.5 F (36.9 C), temperature source Oral, resp. rate 18.  Lab Results:  Results for orders placed during the hospital encounter of 06/24/11 (from the past 48 hour(s))  HEPATIC FUNCTION PANEL     Status: Normal   Collection Time   06/26/11  6:30 AM      Component Value Range Comment   Total Protein 7.4  6.0 - 8.3 (g/dL)    Albumin 4.2  3.5 - 5.2 (g/dL)    AST 33  0 - 37 (U/L)    ALT 23  0 - 53 (U/L)    Alkaline Phosphatase 56  39 - 117 (U/L)    Total Bilirubin 0.5  0.3 - 1.2 (mg/dL)    Bilirubin, Direct 0.1  0.0 - 0.3 (mg/dL)    Indirect Bilirubin 0.4  0.3 - 0.9 (mg/dL)     Physical Findings: CIWA:  CIWA-Ar Total: 1   A/P: Alcohol Dependence & W/D; Opioid Dependence & W/D; HTN; Chronic Pain   Pt requesting discharge Sunday. Continue lorazepam and clonidine tapers. Initiated citalopram yesterday, no SEs reported.  Continue current treatment plan.  Medication education completed.  Pros, cons, risks, potential side effects and benefits were discussed with pt.  Pt agreeable with the plan.  See orders.  Discussed with team.  Lupe Carney 06/26/2011, 2:49 PM

## 2011-06-26 NOTE — Progress Notes (Signed)
Patient scheduled with Dr. Donell Beers at [facility] Outpatient department on 07/15/11 @ 12:15. He will call Charmian Muff to schedule a SA therapy session and to talk to her about CD-IOP.

## 2011-06-26 NOTE — Progress Notes (Deleted)
Pt alert and oriented times four and still has a level 2 for depression level. Pt has no c/o suicidal ideation or homicidal ideation. Pt verbalized waiting to "straighten up" in order to get better and go home.
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
## 2011-06-26 NOTE — Tx Team (Signed)
Interdisciplinary Treatment Plan Update (Adult)  Date: @5 /11/2011  Time Reviewed: @11 :06 AM    Progress in Treatment: Attending groups: Yes Participating in groups:  Yes Taking medication as prescribed: Yes Tolerating medication:  Yes Family/Significant other contact made:  Counselor will contact family. Patient understands diagnosis:  Yes Discussing patient identified problems/goals with staff:  Yes Medical problems stabilized or resolved:  Yes Denies suicidal/homicidal ideation: Yes Issues/concerns per patient self-inventory:  None identified Other: N/A  New problem(s) identified: None Identified  Reason for Continuation of Hospitalization: Depression Withdrawal symptoms  Interventions implemented related to continuation of hospitalization: mood stabilization, medication monitoring and adjustment, group therapy and psycho education, safety checks q 15 mins.   Additional comments: Celexa started. Ativan taper started for withdrawal symptoms. Patient stated that Librium taper did not work at start of admission.   Estimated length of stay: 3-5 days  Discharge Plan: Return home. Will follow-up with [facility] for therapy and medication management. Patient has transportation  And access to medications.  New goal(s): N/A  Review of initial/current patient goals per problem list:    1.  Goal(s): Address substance use  Met:  No  Target date: by discharge  As evidenced by: completing detox protocol and refer to appropriate treatment  2.  Goal (s): Reduce depressive symptoms  Met:  No  Target date: by discharge  As evidenced by: Reducing depression from a 10 to a 3 as reported by pt.     Attendees: Patient:  Evan Keller 06/26/2011 11:08 AM    Family:     Physician:  Lupe Carney, DO 06/26/2011 10:07 AM  Nursing: Waynetta Sandy R.N 06/26/2011 11:05 AM    Case Manager:  Barrie Folk, RN 06/26/2011 10:07 AM     Other:    Other:  Chelsea  Horton LCSWA 06/26/2011 10:10 AM    Other:     Other:      Scribe for Treatment Team:   Barrie Folk 06/26/2011 10:07 AM

## 2011-06-27 DIAGNOSIS — F1020 Alcohol dependence, uncomplicated: Secondary | ICD-10-CM

## 2011-06-27 MED ORDER — LIDOCAINE 5 % EX PTCH
1.0000 | MEDICATED_PATCH | CUTANEOUS | Status: DC
  Administered 2011-06-27: 1 via TRANSDERMAL
  Filled 2011-06-27 (×4): qty 1

## 2011-06-27 MED ORDER — ACETAMINOPHEN 325 MG PO TABS: 650.0000 mg | ORAL_TABLET | Freq: Four times a day (QID) | ORAL | Status: DC | PRN

## 2011-06-27 NOTE — Progress Notes (Signed)
[facility] Case Management Discharge Plan:  Will you be returning to the same living situation after discharge: Yes,  Home At discharge, do you have transportation home?:Yes,  family will pick up at 1 pm Do you have the ability to pay for your medications:Yes,    Interagency Information:     Release of information consent forms completed and in the chart;  Patient's signature needed at discharge.  Patient to Follow up at:  Follow-up Information    Follow up with Dr. Donell Beers - [facility] Outpatient department on 07/15/2011. (Dr. Donell Beers at [facility] Outpatient Department [phone]) on 07/15/11 @ 12:15 PM.  Please Call Charmian Muff  @ [phone] to schedule a therapy appointment .)    Contact information:   [address] [place]         Patient denies SI/HI:   Yes,  pt denies    Aeronautical engineer and Suicide Prevention discussed:  Yes,  with pt  Barrier to discharge identified:NO  Summary and Recommendations:Pt was cleared for D/C and denies any and all S/I and H/I.  Pt was given a note for work stating he can return on wed 07/01/11.   [facility] 06/27/2011, 9:50 AM
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
## 2011-06-27 NOTE — Progress Notes (Signed)
Patient ID: Evan Keller, male   DOB: 04-Nov-1970, 41 y.o.   MRN: [id number] Pt pleasant and cooperative during shift, compliant with medications but remains secluded to his room. Pt denies SI/HI or plans to harm himself at this time. Pt eating snacks and interacting well with his roommate.
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
## 2011-06-27 NOTE — Progress Notes (Signed)
Patient ID: Evan Keller, male   DOB: 05-24-1970, 41 y.o.   MRN: [id number]   [facility] Group Notes:  (Counselor/Nursing/MHT/Case Management/Adjunct)  06/27/2011 1:15 PM  Type of Therapy:  Group Therapy, Dance/Movement Therapy   Participation Level:  Did Not Attend   Rhunette Croft

## 2011-06-27 NOTE — Progress Notes (Signed)
Patient ID: Evan Keller, male   DOB: 19-Sep-1970, 41 y.o.   MRN: [id number] 06-27-11 nursing shift note: pt has back pain most of the day. He was given prn to help with his pain. On his inventory sheet he wrote slept fair, appetite improving, energy low, attention improving and no suicidal or self harm thourght within the last 24 hrs. He wrote plans when he goes home are " stick to the plan". He doesn't see any problems staying on meds when he is discharged. His inventory sheet in incomplete. rn will monitor and q 15 mins checks continue.

## 2011-06-27 NOTE — Progress Notes (Signed)
Pt complaining of back pain on approach.  Pt states Naproxen and Robaxin not helping, states he wishes that the doctor could feel the pain the way he does so they would give him stronger medication.  Doctor on call notified and new orders received.  Pt willing to try Lidocaine patch, heat packs.  States that he is not supposed to take Tylenol due to his liver biopsy.  Pt denies SI/HI/hallucinations at this time.  Support and encouragement offered.  Will continue to monitor.

## 2011-06-28 MED ORDER — GABAPENTIN 300 MG PO CAPS: 300.0000 mg | ORAL_CAPSULE | Freq: Two times a day (BID) | ORAL | Status: DC

## 2011-06-28 MED ORDER — CITALOPRAM HYDROBROMIDE 20 MG PO TABS: 20.0000 mg | ORAL_TABLET | Freq: Every day | ORAL | Status: DC

## 2011-06-28 MED ORDER — LIDOCAINE 5 % EX PTCH: 1.0000 | MEDICATED_PATCH | CUTANEOUS | Status: AC

## 2011-06-28 NOTE — Progress Notes (Signed)
Patient ID: Evan Keller, male   DOB: 11-28-1970, 41 y.o.   MRN: [id number] 06-28-11 nursing discharge note; pt denied any si/hi/av. Pt got his scripts and his f/u appts. He stated he understood his d/c instructions. He got his belongings and a rtn to work Physicist, medical. He was escorted to the lobby. His brother was waiting to transport him home.

## 2011-06-28 NOTE — Progress Notes (Signed)
Patient ID: Evan Keller, male   DOB: 02-Sep-1970, 41 y.o.   MRN: [id number]  Subjective: Evan Keller reports that he is feeling significantly better today. He does complain of some low back pain which made sleep difficult last night. His appetite is improving. He denies any cravings or withdrawal symptoms. He even reports that he is not craving a cigarette. He discussed a desire to stop smoking, and asked about the electronic cigarettes. He denies any suicidal or homicidal ideation; he denies any auditory or visual hallucinations.  Objective: Evan Keller is alert and active this morning. His mood and affect have brightened. He is fully alert and oriented, and of sharp cognitive abilities. He displays no outward signs of psychosis nor intent for self harm or harm of others.  Assessment and plan: We discussed his need for followup and treatment of his addiction, and recommended that he attend 12-step meetings daily for the foreseeable future. We also discussed nicotine replacement products if he wants to discontinue tobacco use. He is being discharged to home, with a plan to attend 12-step meetings for recovery. He is advised to not take any substances with potential for abuse or dependence.

## 2011-06-30 NOTE — Progress Notes (Signed)
Patient Discharge Instructions:  After Visit Summary (AVS):   Access to EMR:  06/30/2011 Psychiatric Admission Assessment Note:   Access to EMR:  06/30/2011 Suicide Risk Assessment - Discharge Assessment:   Access to EMR:  06/30/2011 Next Level Care Provider Has Access to the EMR, 06/30/2011  Wandra Scot, 06/30/2011, 5:25 PM
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
## 2011-07-15 ENCOUNTER — Ambulatory Visit (HOSPITAL_COMMUNITY): Payer: Self-pay | Admitting: Psychiatry

## 2012-02-15 ENCOUNTER — Other Ambulatory Visit: Payer: Self-pay | Admitting: Nephrology

## 2012-02-15 ENCOUNTER — Ambulatory Visit
Admission: RE | Admit: 2012-02-15 | Discharge: 2012-02-15 | Disposition: A | Discharge status: Home / Self Care | Payer: Commercial Managed Care - PPO | Source: Ambulatory Visit | Attending: Nephrology | Admitting: Nephrology

## 2012-02-15 DIAGNOSIS — F17200 Nicotine dependence, unspecified, uncomplicated: Secondary | ICD-10-CM

## 2012-06-27 ENCOUNTER — Encounter (HOSPITAL_COMMUNITY): Payer: Self-pay | Admitting: Emergency Medicine

## 2012-06-27 ENCOUNTER — Emergency Department (HOSPITAL_COMMUNITY)
Admission: EM | Admit: 2012-06-27 | Discharge: 2012-06-27 | Disposition: A | Discharge status: Home / Self Care | Payer: BC Managed Care – PPO | Attending: Emergency Medicine | Admitting: Emergency Medicine

## 2012-06-27 DIAGNOSIS — M1990 Unspecified osteoarthritis, unspecified site: Secondary | ICD-10-CM | POA: Insufficient documentation

## 2012-06-27 DIAGNOSIS — Z79899 Other long term (current) drug therapy: Secondary | ICD-10-CM | POA: Insufficient documentation

## 2012-06-27 DIAGNOSIS — F411 Generalized anxiety disorder: Secondary | ICD-10-CM | POA: Insufficient documentation

## 2012-06-27 DIAGNOSIS — Z8739 Personal history of other diseases of the musculoskeletal system and connective tissue: Secondary | ICD-10-CM | POA: Insufficient documentation

## 2012-06-27 DIAGNOSIS — M549 Dorsalgia, unspecified: Secondary | ICD-10-CM | POA: Insufficient documentation

## 2012-06-27 DIAGNOSIS — F3289 Other specified depressive episodes: Secondary | ICD-10-CM | POA: Insufficient documentation

## 2012-06-27 DIAGNOSIS — F329 Major depressive disorder, single episode, unspecified: Secondary | ICD-10-CM | POA: Insufficient documentation

## 2012-06-27 DIAGNOSIS — K700 Alcoholic fatty liver: Secondary | ICD-10-CM | POA: Insufficient documentation

## 2012-06-27 DIAGNOSIS — R748 Abnormal levels of other serum enzymes: Secondary | ICD-10-CM | POA: Insufficient documentation

## 2012-06-27 DIAGNOSIS — I10 Essential (primary) hypertension: Secondary | ICD-10-CM | POA: Insufficient documentation

## 2012-06-27 DIAGNOSIS — R112 Nausea with vomiting, unspecified: Secondary | ICD-10-CM | POA: Insufficient documentation

## 2012-06-27 DIAGNOSIS — G4700 Insomnia, unspecified: Secondary | ICD-10-CM | POA: Insufficient documentation

## 2012-06-27 DIAGNOSIS — Z8719 Personal history of other diseases of the digestive system: Secondary | ICD-10-CM | POA: Insufficient documentation

## 2012-06-27 DIAGNOSIS — F17200 Nicotine dependence, unspecified, uncomplicated: Secondary | ICD-10-CM | POA: Insufficient documentation

## 2012-06-27 LAB — COMPREHENSIVE METABOLIC PANEL
AST: 109 U/L — ABNORMAL HIGH (ref 0–37)
Albumin: 3.8 g/dL (ref 3.5–5.2)
BUN: 6 mg/dL (ref 6–23)
Creatinine, Ser: 0.76 mg/dL (ref 0.50–1.35)
Potassium: 4.1 mEq/L (ref 3.5–5.1)
Total Protein: 8.7 g/dL — ABNORMAL HIGH (ref 6.0–8.3)

## 2012-06-27 LAB — COMPREHENSIVE METABOLIC PANEL WITH GFR
ALT: 52 U/L (ref 0–53)
Alkaline Phosphatase: 183 U/L — ABNORMAL HIGH (ref 39–117)
CO2: 28 meq/L (ref 19–32)
Calcium: 9.4 mg/dL (ref 8.4–10.5)
Chloride: 97 meq/L (ref 96–112)
GFR calc Af Amer: >90 mL/min (ref >90)
GFR calc non Af Amer: >90 mL/min (ref >90)
Glucose, Bld: 97 mg/dL (ref 70–99)
Sodium: 135 meq/L (ref 135–145)
Total Bilirubin: 0.9 mg/dL (ref 0.3–1.2)

## 2012-06-27 LAB — CBC WITH DIFFERENTIAL/PLATELET
Basophils Absolute: 0 10*3/uL (ref 0.0–0.1)
Basophils Relative: 1 % (ref 0–1)
Eosinophils Absolute: 0.1 10*3/uL (ref 0.0–0.7)
Eosinophils Relative: 2 % (ref 0–5)
HCT: 46.2 % (ref 39.0–52.0)
Hemoglobin: 16.5 g/dL (ref 13.0–17.0)
Lymphocytes Relative: 22 % (ref 12–46)
Lymphs Abs: 1.2 K/uL (ref 0.7–4.0)
MCH: 34.2 pg — ABNORMAL HIGH (ref 26.0–34.0)
MCHC: 35.7 g/dL (ref 30.0–36.0)
MCV: 95.7 fL (ref 78.0–100.0)
Monocytes Absolute: 0.6 10*3/uL (ref 0.1–1.0)
Monocytes Relative: 12 % (ref 3–12)
Neutro Abs: 3.5 K/uL (ref 1.7–7.7)
Neutrophils Relative %: 64 % (ref 43–77)
Platelets: 133 K/uL — ABNORMAL LOW (ref 150–400)
RBC: 4.83 MIL/uL (ref 4.22–5.81)
RDW: 14.1 % (ref 11.5–15.5)
WBC: 5.5 K/uL (ref 4.0–10.5)

## 2012-06-27 LAB — LIPASE, BLOOD: Lipase: 45 U/L (ref 11–59)

## 2012-06-27 MED ORDER — HYDROMORPHONE HCL PF 2 MG/ML IJ SOLN
2.0000 mg | Freq: Once | INTRAMUSCULAR | Status: AC
  Administered 2012-06-27: 2 mg via INTRAVENOUS
  Filled 2012-06-27: qty 1

## 2012-06-27 MED ORDER — SODIUM CHLORIDE 0.9 % IV BOLUS (SEPSIS)
1000.0000 mL | Freq: Once | INTRAVENOUS | Status: AC
  Administered 2012-06-27: 1000 mL via INTRAVENOUS

## 2012-06-27 MED ORDER — ONDANSETRON 4 MG PO TBDP: 4.0000 mg | ORAL_TABLET | Freq: Three times a day (TID) | ORAL | Status: AC | PRN

## 2012-06-27 MED ORDER — ONDANSETRON HCL 4 MG/2ML IJ SOLN
4.0000 mg | Freq: Once | INTRAMUSCULAR | Status: AC
  Administered 2012-06-27: 4 mg via INTRAVENOUS
  Filled 2012-06-27: qty 2

## 2012-06-27 NOTE — ED Provider Notes (Signed)
Medical screening examination/treatment/procedure(s) were performed by non-physician practitioner and as supervising physician I was immediately available for consultation/collaboration.   Gavin Pound. Oletta Lamas, MD 06/27/12 2336
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
## 2012-06-27 NOTE — ED Notes (Signed)
Pt complains of abdominal pain with vomiting x 3 days

## 2013-03-26 ENCOUNTER — Emergency Department (HOSPITAL_COMMUNITY): Payer: BC Managed Care – PPO

## 2013-03-26 ENCOUNTER — Emergency Department (HOSPITAL_COMMUNITY): Payer: Commercial Managed Care - PPO

## 2013-03-26 ENCOUNTER — Emergency Department (HOSPITAL_COMMUNITY)
Admission: EM | Admit: 2013-03-26 | Discharge: 2013-04-16 | Disposition: E | Payer: Commercial Managed Care - PPO | Attending: Emergency Medicine | Admitting: Emergency Medicine

## 2013-03-26 ENCOUNTER — Encounter (HOSPITAL_COMMUNITY): Payer: Self-pay | Admitting: Emergency Medicine

## 2013-03-26 DIAGNOSIS — R404 Transient alteration of awareness: Secondary | ICD-10-CM | POA: Insufficient documentation

## 2013-03-26 DIAGNOSIS — X72XXXA Intentional self-harm by handgun discharge, initial encounter: Secondary | ICD-10-CM | POA: Insufficient documentation

## 2013-03-26 DIAGNOSIS — I469 Cardiac arrest, cause unspecified: Secondary | ICD-10-CM | POA: Insufficient documentation

## 2013-03-26 DIAGNOSIS — S0190XA Unspecified open wound of unspecified part of head, initial encounter: Secondary | ICD-10-CM | POA: Insufficient documentation

## 2013-03-26 DIAGNOSIS — W3400XA Accidental discharge from unspecified firearms or gun, initial encounter: Secondary | ICD-10-CM

## 2013-03-26 DIAGNOSIS — R0681 Apnea, not elsewhere classified: Secondary | ICD-10-CM | POA: Insufficient documentation

## 2013-03-26 DIAGNOSIS — I498 Other specified cardiac arrhythmias: Secondary | ICD-10-CM | POA: Insufficient documentation

## 2013-03-26 DIAGNOSIS — S0193XA Puncture wound without foreign body of unspecified part of head, initial encounter: Secondary | ICD-10-CM

## 2013-03-26 MED ORDER — EPINEPHRINE HCL 0.1 MG/ML IJ SOSY
PREFILLED_SYRINGE | INTRAMUSCULAR | Status: AC | PRN
  Administered 2013-03-26 (×3): 1 mg via INTRAVENOUS

## 2013-03-26 MED ORDER — SODIUM CHLORIDE 0.9 % IV BOLUS (SEPSIS)
1000.0000 mL | Freq: Once | INTRAVENOUS | Status: AC
  Administered 2013-03-26: 1000 mL via INTRAVENOUS

## 2013-03-26 MED ORDER — EPINEPHRINE HCL 1 MG/ML IJ SOLN
0.5000 ug/min | INTRAVENOUS | Status: DC
  Administered 2013-03-26: 1 ug/min via INTRAVENOUS
  Filled 2013-03-26: qty 1

## 2013-03-26 MED ORDER — EPINEPHRINE HCL 0.1 MG/ML IJ SOSY: 0.5000 mg | PREFILLED_SYRINGE | Freq: Once | INTRAMUSCULAR | Status: DC

## 2013-03-27 ENCOUNTER — Encounter (HOSPITAL_COMMUNITY): Payer: Self-pay | Admitting: Emergency Medicine

## 2013-03-27 DIAGNOSIS — S0190XA Unspecified open wound of unspecified part of head, initial encounter: Secondary | ICD-10-CM

## 2013-03-27 DIAGNOSIS — S069X9A Unspecified intracranial injury with loss of consciousness of unspecified duration, initial encounter: Secondary | ICD-10-CM

## 2013-03-27 LAB — TYPE AND SCREEN
ABO/RH(D): AB POS
ANTIBODY SCREEN: NEGATIVE
UNIT DIVISION: 0
Unit division: 0

## 2013-03-27 LAB — ABO/RH: ABO/RH(D): AB POS

## 2013-03-27 MED FILL — Medication: Qty: 1 | Status: AC

## 2013-03-27 NOTE — Progress Notes (Signed)
Debbra Ridinggan study discontinued and patient taken off ventilator

## 2013-03-27 NOTE — ED Notes (Signed)
RT at bedside to put patient back onto ventilator. CSI at bedside.

## 2013-03-27 NOTE — ED Notes (Signed)
ME informed this RN pt is not a [facility] because of former IV drug use. Body prep will continue and pt will be placed in the morgue.
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
## 2013-03-27 NOTE — ED Notes (Signed)
Family at bedside, This RN with family during viewing.

## 2013-03-27 NOTE — ED Notes (Signed)
Family updated as to patient's status and at bedside  

## 2013-03-27 NOTE — ED Notes (Signed)
CSI at bedside.

## 2013-03-27 NOTE — ED Notes (Signed)
Chaplin at bedside with family giving emotional support

## 2013-03-27 NOTE — ED Notes (Signed)
Neuro surgeon Dr. Franky Machoabbell was called at [phone]

## 2013-03-27 NOTE — Progress Notes (Signed)
Patient started on the [place] study. Patient placed on the following vent settings PRVC 12/750/100%/ Tidal volume of 750 is 10ccl/kg of ideal body weight

## 2013-03-27 NOTE — Progress Notes (Signed)
Chaplain gave grief support to pt's family through presence, compassion and listening.  Chaplain was present for the ED physician visit with pt's family.  Chaplain went with family to trauma B where he continued to offer emotional support.  Chaplain also acted as Print production plannerliaison between family and ED staff.   03/27/13 0100  Clinical Encounter Type  Visited With Family;Patient and family together  Visit Type Spiritual support  Spiritual Encounters  Spiritual Needs Grief support  Stress Factors  Family Stress Factors Loss    Rulon Abideavid B Sherrod, chaplain pager [phone]

## 2013-03-28 ENCOUNTER — Other Ambulatory Visit (INDEPENDENT_AMBULATORY_CARE_PROVIDER_SITE_OTHER): Payer: Self-pay | Admitting: Surgery

## 2013-04-09 IMAGING — CR DG CHEST 2V
3 series · 3 of 3 positions shown · non-contrast
Comparison: Radiographs 12/15/2009 and 11/30/2007.

CLINICAL DATA: History of hypertension and smoking.

CHEST - 2 VIEW

[view not recorded (1 of 3)]
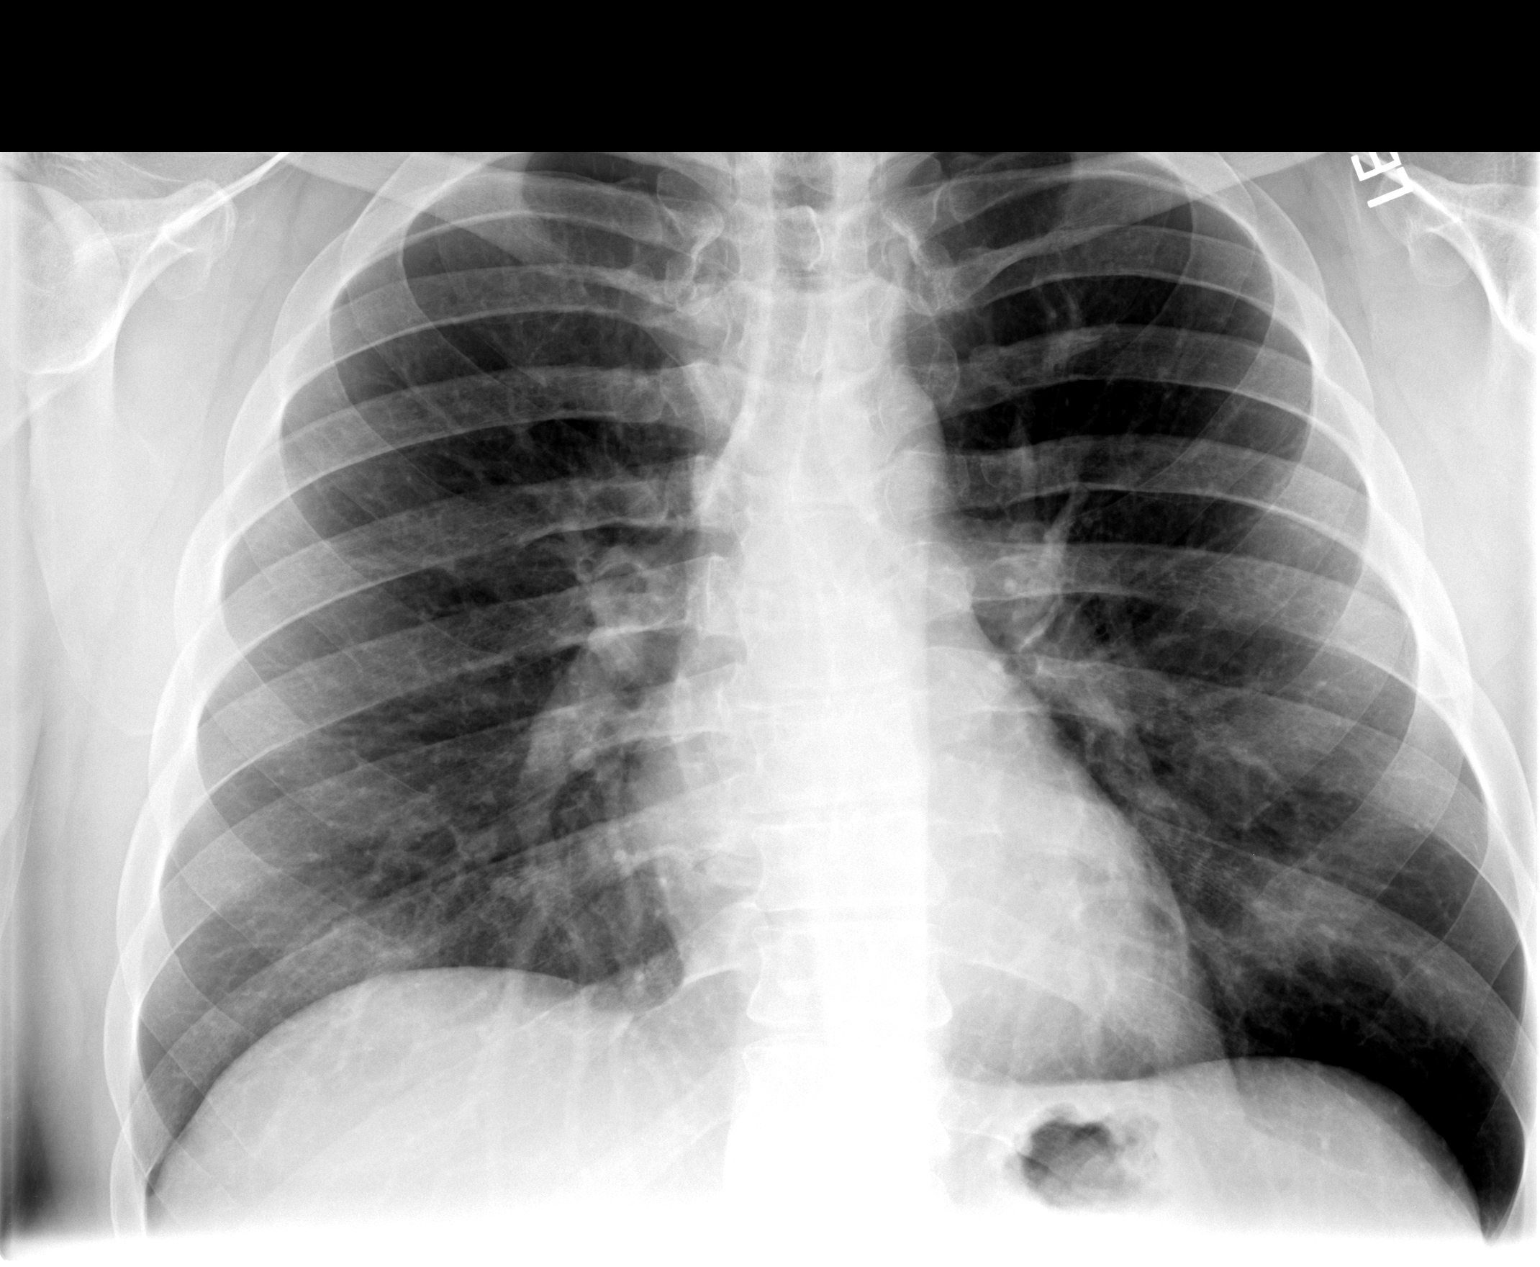

[view not recorded (2 of 3)]
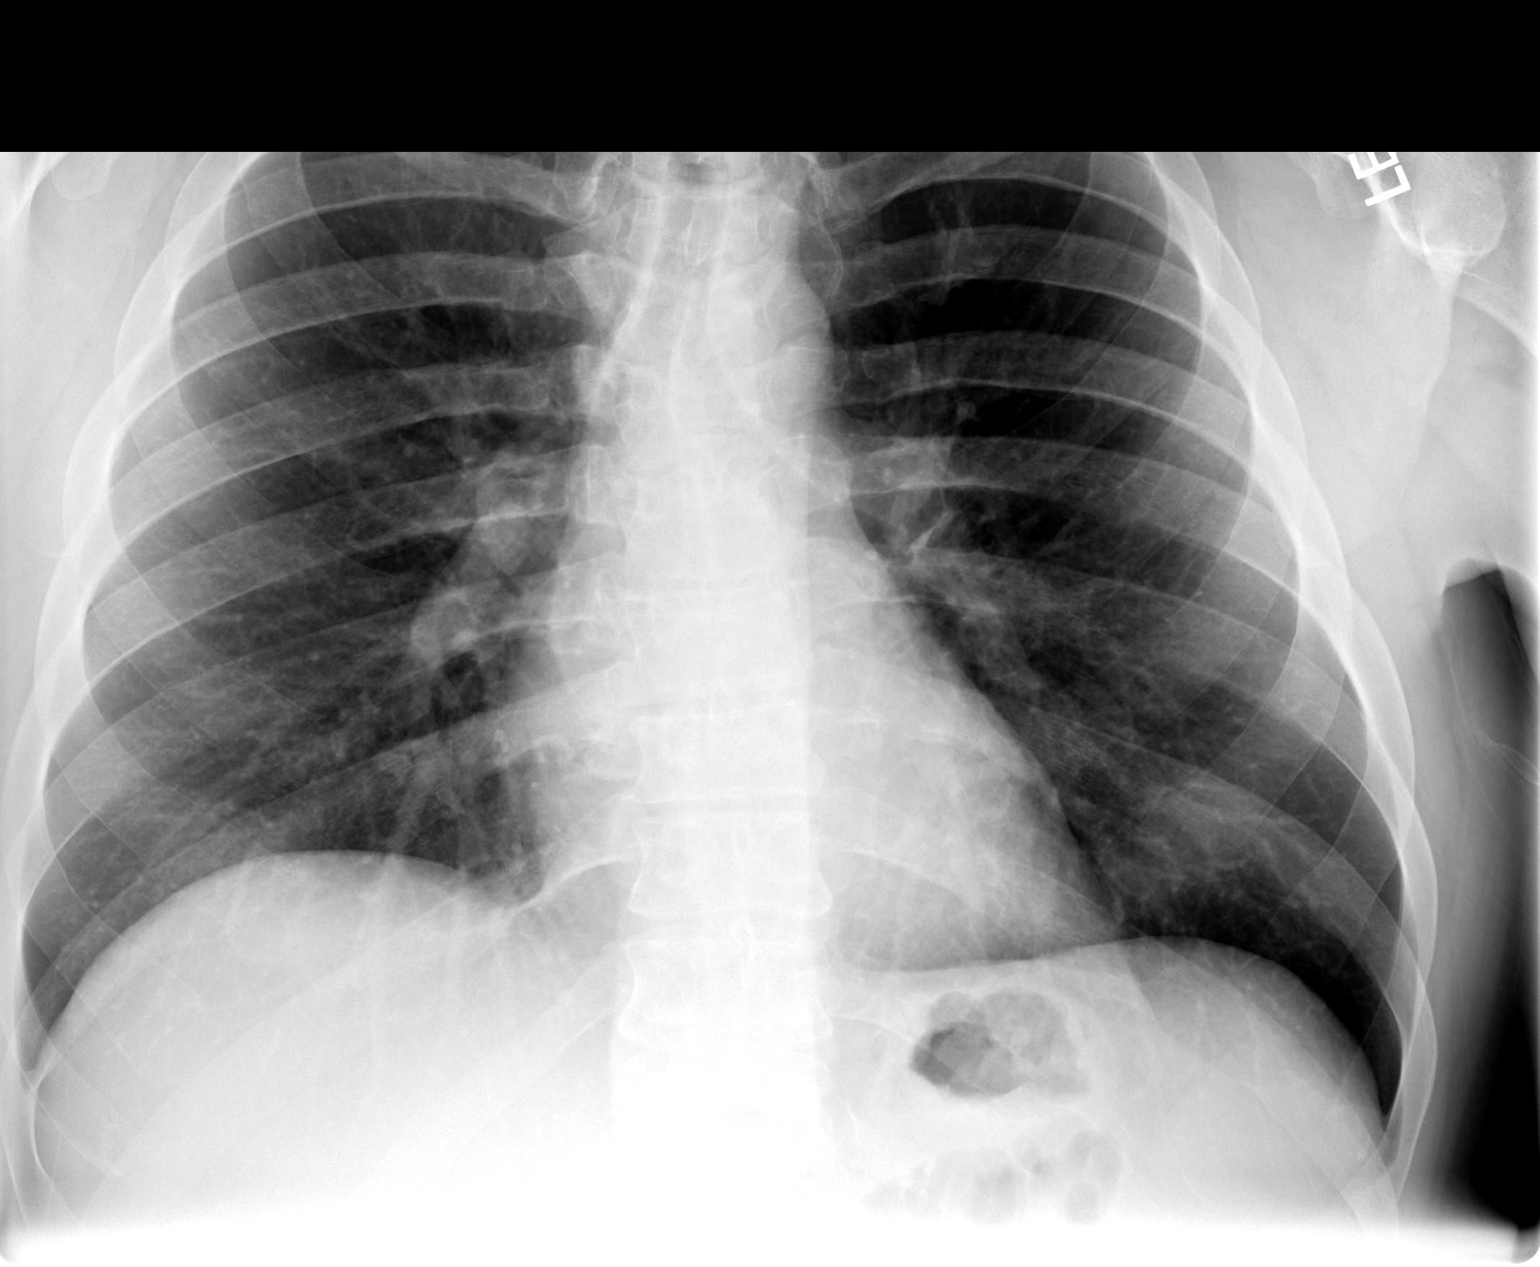

[view not recorded (3 of 3)]
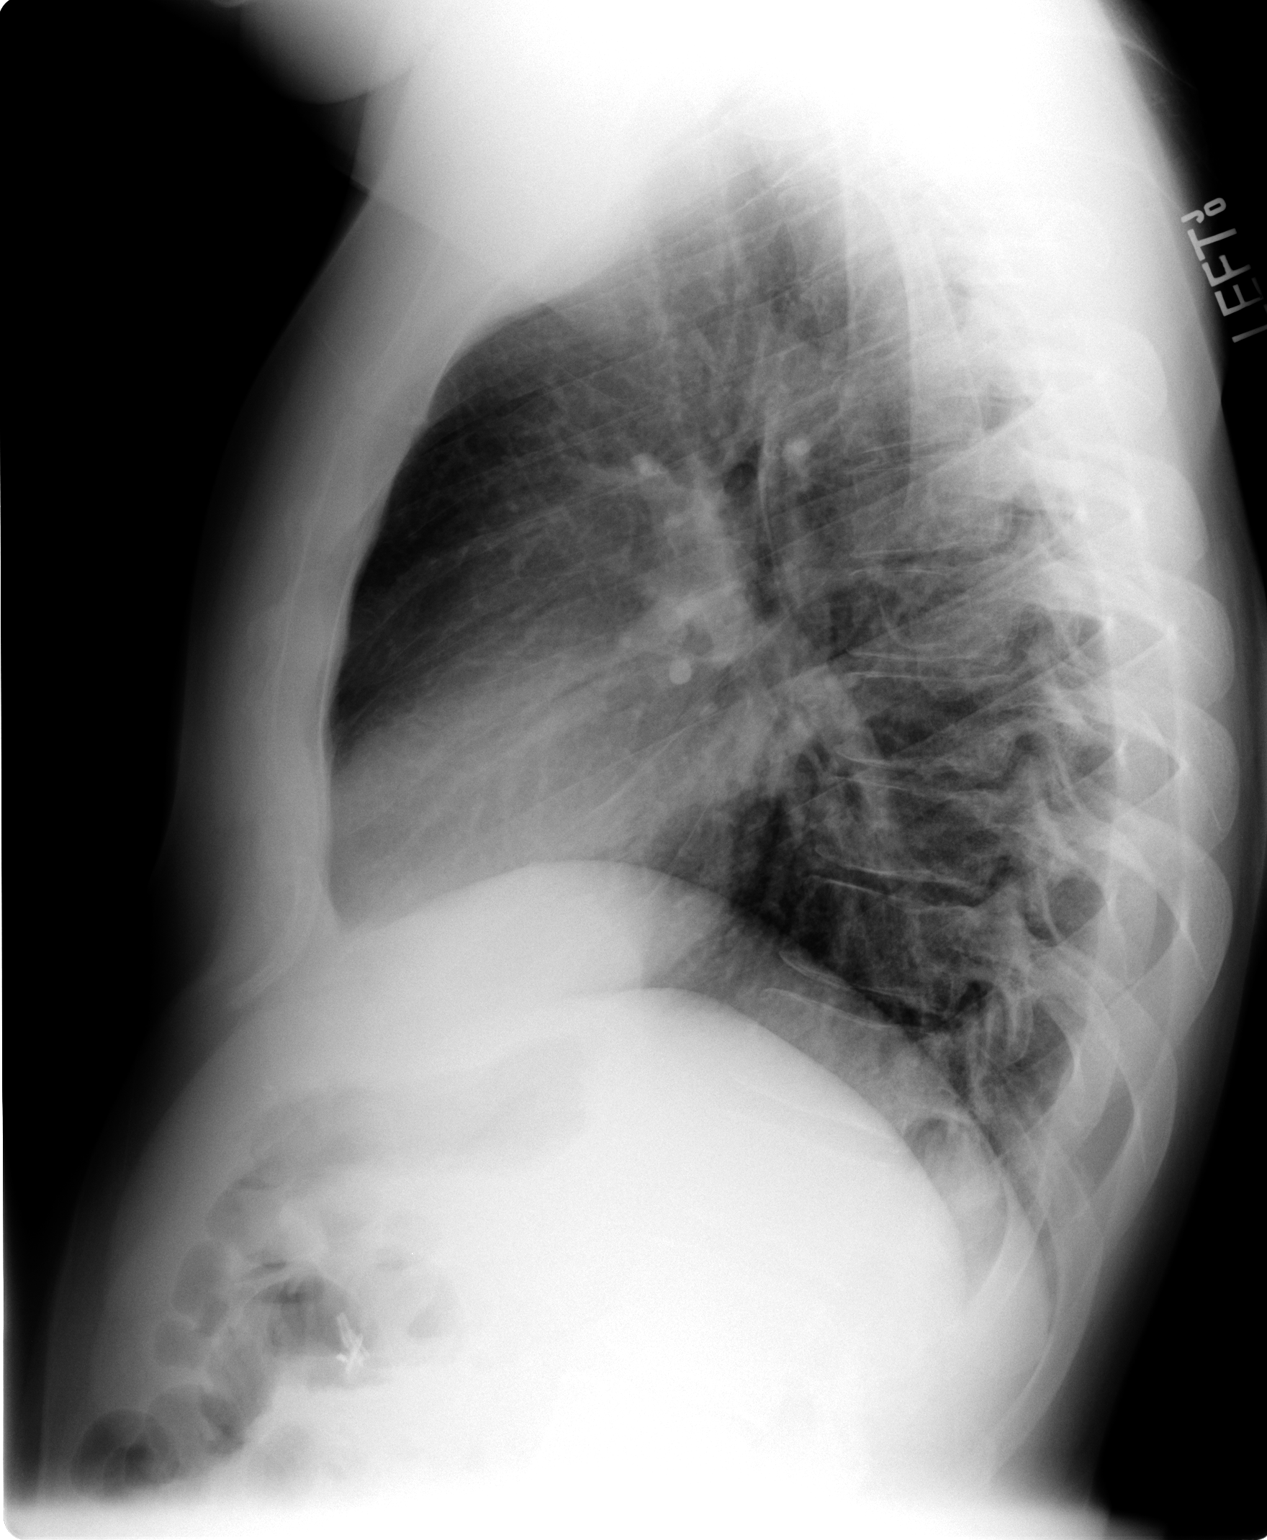

[3 of 3 positions shown; findings below may reference images not displayed]

FINDINGS: The heart size and mediastinal contours are normal. The
lungs are clear. There is no pleural effusion or pneumothorax. No
acute osseous findings are identified.  Cholecystectomy clips are
noted.
IMPRESSION: No active cardiopulmonary process.

## 2013-04-16 NOTE — ED Notes (Signed)
Neuro surgeon Dr. Franky Machoabbell paged.
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
## 2013-04-16 NOTE — ED Notes (Signed)
Patient time of death occurred at 23:29. ?
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
## 2013-04-16 NOTE — ED Notes (Signed)
Neuro surgery will not be seeing the patient because pt has expired.

## 2013-04-16 DEATH — deceased

## 2013-05-15 ENCOUNTER — Telehealth: Payer: Self-pay

## 2013-05-15 NOTE — Telephone Encounter (Signed)
Patient past away per [facility]bit[facility] in [employer] °
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
